# Patient Record
Sex: Male | Born: 1970 | Race: Black or African American | Hispanic: No | Marital: Married | Smoking: Current some day smoker
Health system: Southern US, Community
[De-identification: ages and names within clinical notes are randomized; demographics above are authoritative.]

## PROBLEM LIST (undated history)

## (undated) DIAGNOSIS — I10 Essential (primary) hypertension: Secondary | ICD-10-CM

## (undated) DIAGNOSIS — A0472 Enterocolitis due to Clostridium difficile, not specified as recurrent: Secondary | ICD-10-CM

## (undated) DIAGNOSIS — Q231 Congenital insufficiency of aortic valve: Secondary | ICD-10-CM

## (undated) DIAGNOSIS — E119 Type 2 diabetes mellitus without complications: Secondary | ICD-10-CM

## (undated) DIAGNOSIS — R011 Cardiac murmur, unspecified: Secondary | ICD-10-CM

## (undated) DIAGNOSIS — K219 Gastro-esophageal reflux disease without esophagitis: Secondary | ICD-10-CM

## (undated) DIAGNOSIS — Q2381 Bicuspid aortic valve: Secondary | ICD-10-CM

## (undated) DIAGNOSIS — E785 Hyperlipidemia, unspecified: Secondary | ICD-10-CM

## (undated) DIAGNOSIS — K5792 Diverticulitis of intestine, part unspecified, without perforation or abscess without bleeding: Secondary | ICD-10-CM

## (undated) HISTORY — PX: COLONOSCOPY: SHX174

## (undated) HISTORY — DX: Bicuspid aortic valve: Q23.81

## (undated) HISTORY — DX: Enterocolitis due to Clostridium difficile, not specified as recurrent: A04.72

## (undated) HISTORY — DX: Type 2 diabetes mellitus without complications: E11.9

## (undated) HISTORY — DX: Gastro-esophageal reflux disease without esophagitis: K21.9

## (undated) HISTORY — DX: Cardiac murmur, unspecified: R01.1

## (undated) HISTORY — DX: Congenital insufficiency of aortic valve: Q23.1

## (undated) HISTORY — DX: Essential (primary) hypertension: I10

---

## 1996-07-09 HISTORY — PX: OTHER SURGICAL HISTORY: SHX169

## 2007-05-25 ENCOUNTER — Inpatient Hospital Stay (HOSPITAL_COMMUNITY): Admission: EM | Admit: 2007-05-25 | Discharge: 2007-05-27 | Payer: Self-pay | Admitting: Emergency Medicine

## 2007-05-27 ENCOUNTER — Ambulatory Visit: Payer: Self-pay | Admitting: Gastroenterology

## 2008-02-18 ENCOUNTER — Ambulatory Visit: Payer: Self-pay | Admitting: General Practice

## 2008-09-14 ENCOUNTER — Encounter: Payer: Self-pay | Admitting: Interventional Cardiology

## 2008-12-09 IMAGING — CT CT PELVIS W/ CM
1 of 3 series · 14 of 32 positions shown, 19 images · IV contrast (Omnipaque 300)
Comparison: none

HISTORY: Abdominal pain, nausea, vomiting

[Series 2: abd_pel 5.0 b40f · axial · 0.69mm/px · z∈[+486,+901]mm · 14 of 95 slices shown, 19 images]
[im 6/95  soft-tissue]
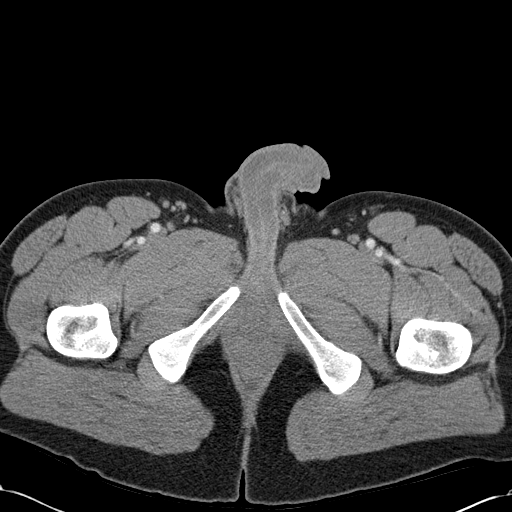
[im 6/95  bone]
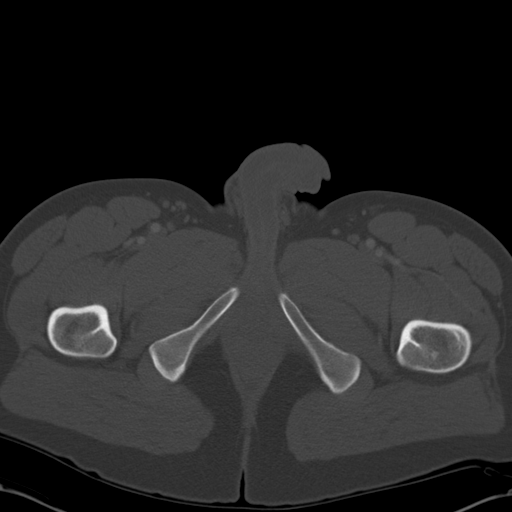
[im 11/95  soft-tissue]
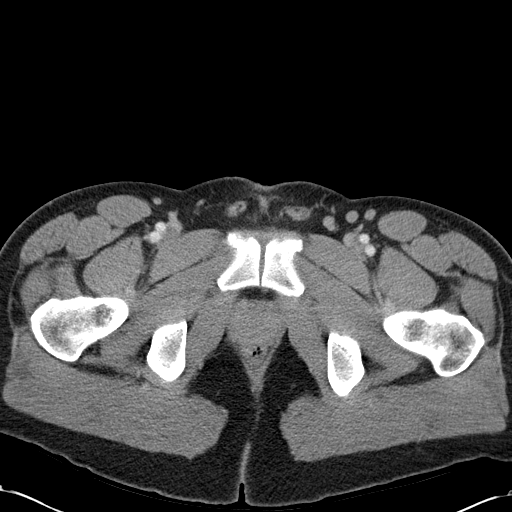
[im 21/95  soft-tissue]
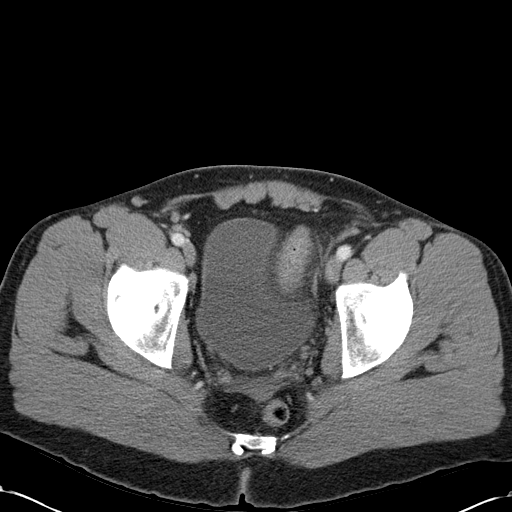
[im 27/95  soft-tissue]
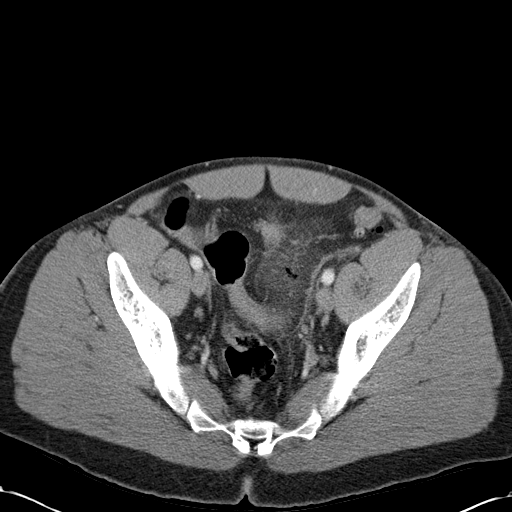
[im 32/95  soft-tissue]
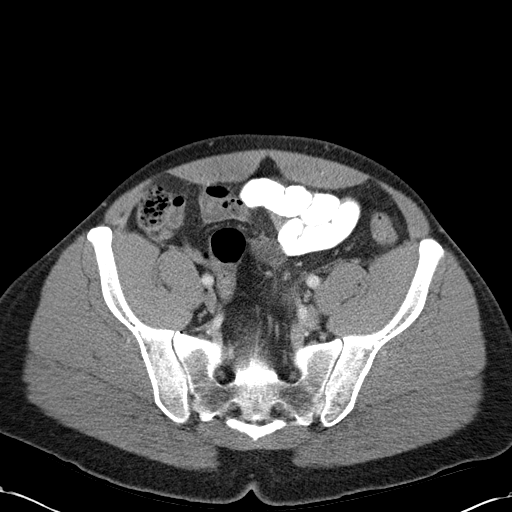
[im 42/95  soft-tissue]
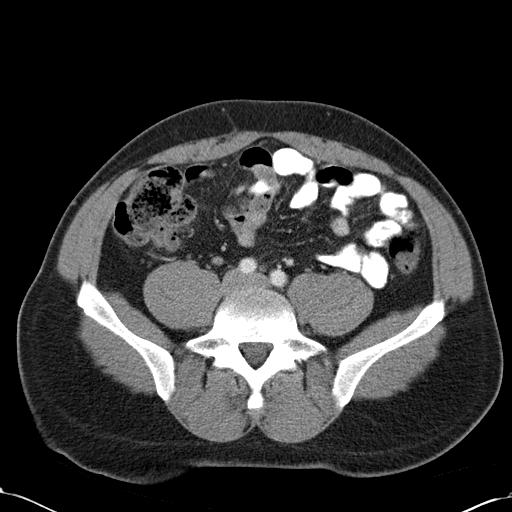
[im 48/95  soft-tissue]
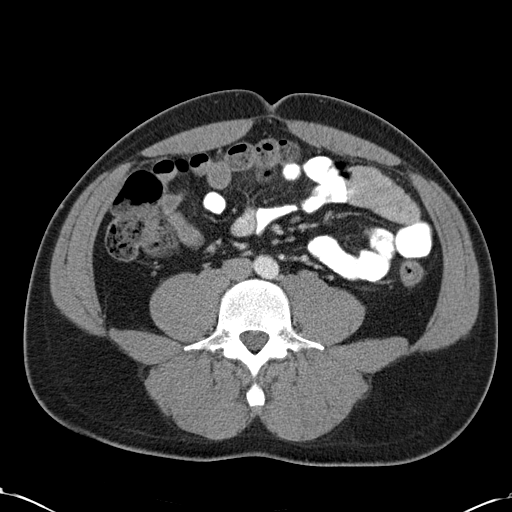
[im 53/95  soft-tissue]
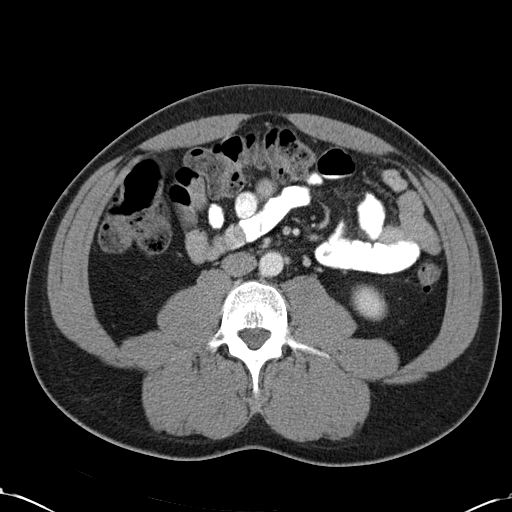
[im 63/95  soft-tissue]
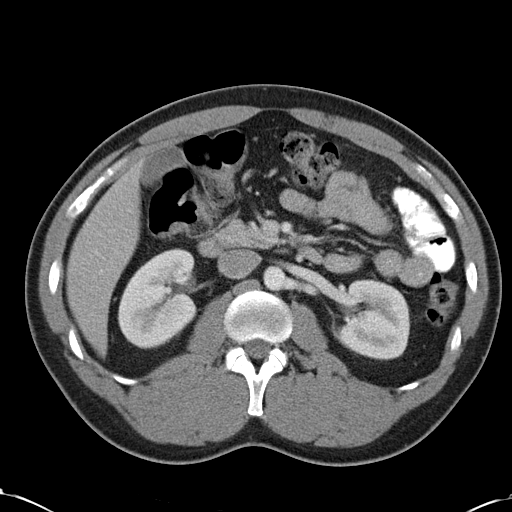
[im 63/95  bone]
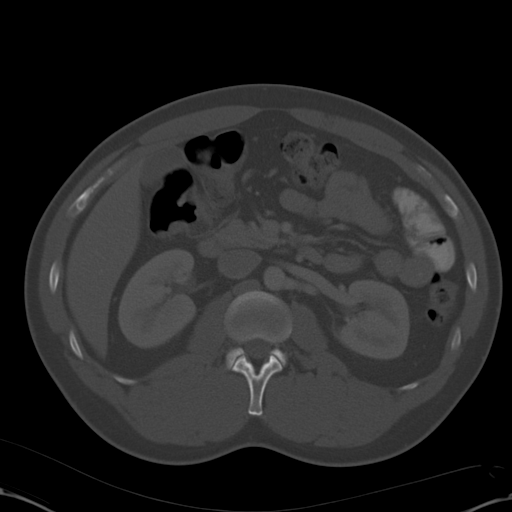
[im 68/95  soft-tissue]
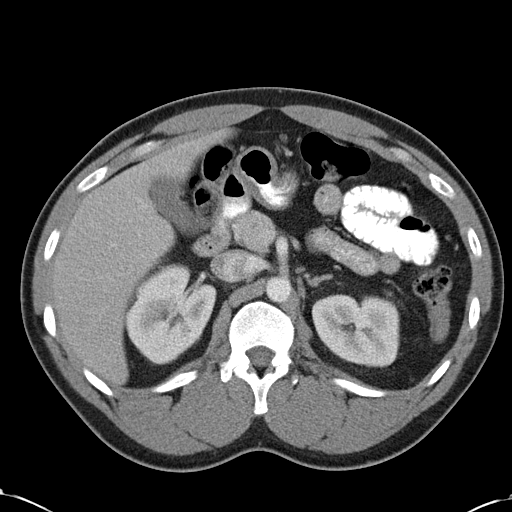
[im 74/95  soft-tissue]
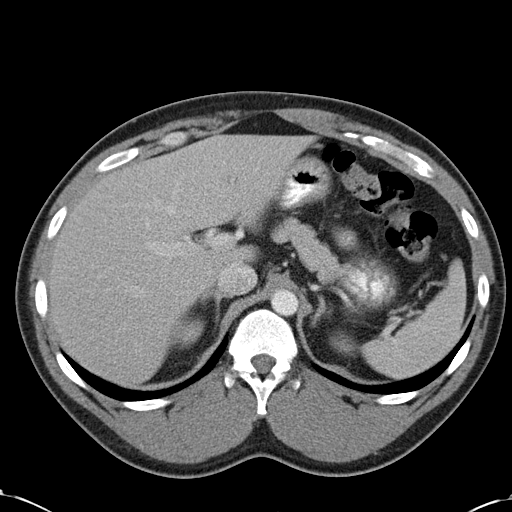
[im 74/95  lung]
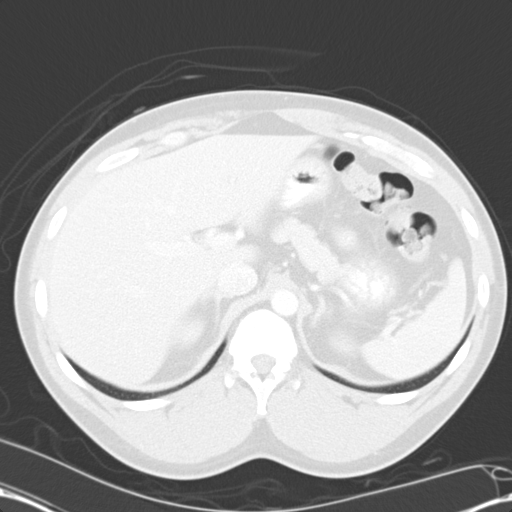
[im 79/95  lung]
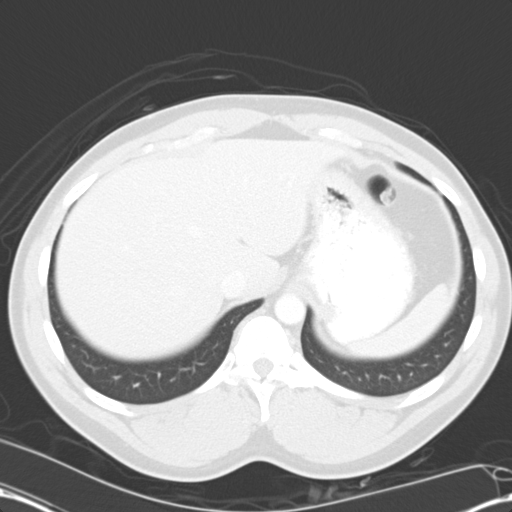
[im 84/95  soft-tissue]
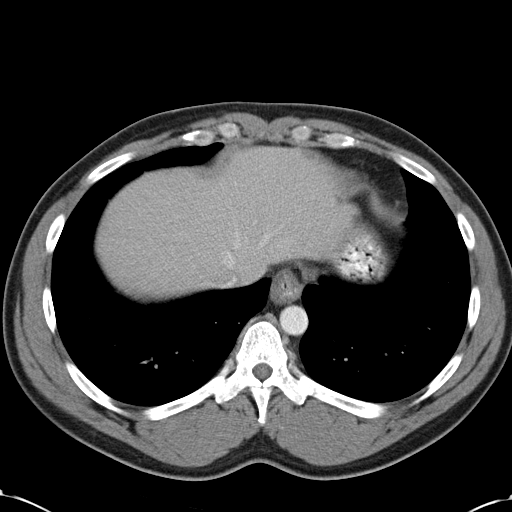
[im 84/95  lung]
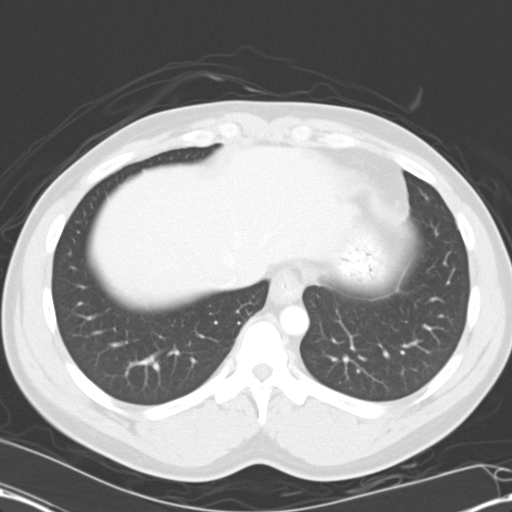
[im 89/95  soft-tissue]
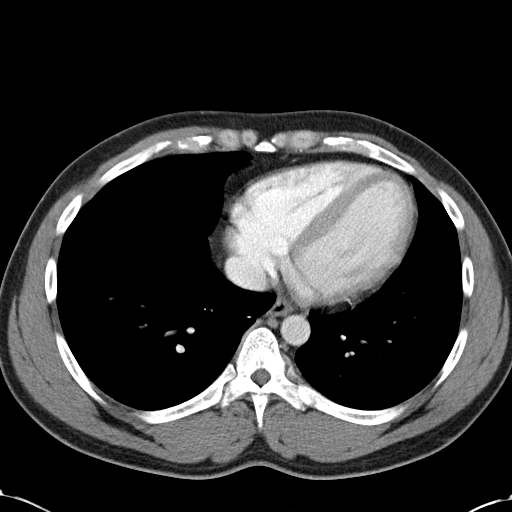
[im 89/95  lung]
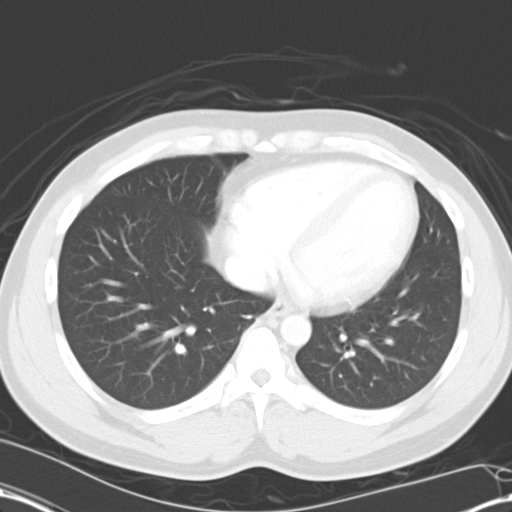

[14 of 32 positions shown; findings below may reference images not displayed]

CT ABDOMEN AND PELVIS WITH CONTRAST:

Multidetector helical CT imaging abdomen and pelvis performed.
Sagittal and coronal images are reconstructed from the axial data set.
Exam utilized dilute oral contrast and 100 cc 8mnipaque-N55.
No prior exam for comparison.

CT ABDOMEN:

Lung bases clear.
Tiny nonspecific low attenuation focus inferior aspect right lobe liver image
30, too small to characterize.
Remainder of liver, spleen, pancreas, kidneys, and adrenal glands normal.
Stomach and upper abdominal bowel loops unremarkable.
No mass, adenopathy, free fluid, or inflammatory process.
IMPRESSION: No acute upper abdominal abnormalities, see below.

CT PELVIS:

Scattered diverticula sigmoid colon with extensive sigmoid wall thickening and
pericolic inflammatory changes compatible with acute diverticulitis.
Small focus of extraluminal gas identified within sigmoid mesocolon, images 68
through 70, 12 mm diameter.
No discrete abscess or extraluminal fluid collection.
Small amount of nonspecific free pelvic fluid.
Unremarkable bladder.
Normal appendix.
No pelvic mass, adenopathy, or hernia.
Bones unremarkable
IMPRESSION: Sigmoid diverticulitis with small extraluminal gas collection, 12 mm greatest
size.
Small nonspecific free pelvic fluid.
Findings called to [REDACTED] at time of interpretation.

## 2010-02-08 ENCOUNTER — Emergency Department (HOSPITAL_COMMUNITY): Admission: EM | Admit: 2010-02-08 | Discharge: 2010-02-08 | Payer: Self-pay | Admitting: Emergency Medicine

## 2010-09-07 DIAGNOSIS — A0472 Enterocolitis due to Clostridium difficile, not specified as recurrent: Secondary | ICD-10-CM

## 2010-09-07 HISTORY — DX: Enterocolitis due to Clostridium difficile, not specified as recurrent: A04.72

## 2010-09-22 LAB — CBC
MCH: 31.2 pg (ref 26.0–34.0)
MCV: 93.4 fL (ref 78.0–100.0)
RBC: 4.47 MIL/uL (ref 4.22–5.81)

## 2010-09-22 LAB — BASIC METABOLIC PANEL
Creatinine, Ser: 1.08 mg/dL (ref 0.4–1.5)
GFR calc non Af Amer: >60 mL/min (ref >60)
Potassium: 3.5 mEq/L (ref 3.5–5.1)
Sodium: 138 mEq/L (ref 135–145)

## 2010-09-22 LAB — DIFFERENTIAL
Basophils Absolute: 0.1 10*3/uL (ref 0.0–0.1)
Basophils Relative: 1 % (ref 0–1)
Eosinophils Absolute: 0.7 10*3/uL (ref 0.0–0.7)
Lymphocytes Relative: 41 % (ref 12–46)
Lymphs Abs: 4.2 10*3/uL — ABNORMAL HIGH (ref 0.7–4.0)
Monocytes Relative: 5 % (ref 3–12)
Neutrophils Relative %: 47 % (ref 43–77)

## 2010-11-21 NOTE — Discharge Summary (Signed)
NAMEMARQUIES, [name]                ACCOUNT NO.:  [id number]   MEDICAL RECORD NO.:  [id number]          PATIENT TYPE:  INP   LOCATION:  A320                          FACILITY:  [facility]   PHYSICIAN:  Skeet Latch, DO    DATE OF BIRTH:  05-09-1971   DATE OF ADMISSION:  05/25/2007  DATE OF DISCHARGE:  11/18/2008LH                               DISCHARGE SUMMARY   DISCHARGE DIAGNOSES:  1. Acute diverticulitis.  2. Hypertension.  3. Leukocytosis.   BRIEF HOSPITAL COURSE:  Mr. [name] is a 40 year old African American  male who presented with lower abdominal pain.  The patient states for  approximately 3 days he is having abdominal pain.  He noticed that after  dinner he began to have an upset stomach and went to the rest room and  had a bowel movement.  Two hours later he developed cramps and had  steady abdominal pain ever since.  The patient states that initially it  felt like gas-type pains and when he started to have another bowel  movement, he could not and he started to become diaphoretic.  The  patient went to work the following day despite the pain and went to an  urgent care.  Urgent care diagnosed him with viral gastroenteritis and  he was sent home.  The patient went to his daughter's birthday party  with abdominal pain, and he could not tolerate it and came to the  emergency room for evaluation.  The patient in the emergency room had a  CT scan which showed acute diverticulitis.  The patient was admitted to  a general medical bed.  He was kept n.p.o., placed on IV fluids as well  as IV antibiotics.  The patient's diet increased to clear liquids.  The  patient started to have remitting of his pain as his diet was increased.  The patient's initial white count started to come down also during his  hospital stay.  The patient's pain started at an 8/9-10 and had  decreased to about 2-3/10.  GI was consulted on the last day prior to  discharge and agreed with the current medical  treatment.  It was felt  that May 27, 2007, the patient was stable enough to be discharged  to home.   VITALS ON DISCHARGE:  Temperature is 97.5, pulse 72, respirations 16,  blood pressure 130/99, saturating 98% on room air.   DISCHARGE LABS:  Sodium 137, potassium 3.8, chloride 100, CO2 28,  glucose 118, BUN 6, creatinine 1.12.  White count 10.3, hemoglobin 12.5,  hematocrit 39.0, platelet count was 280.   MEDICATIONS ON DISCHARGE:  The patient was given a prescription for  Cipro and Flagyl to complete a 10-day course.  He is to maintain his  hydrochlorothiazide 25 mg daily.   CONDITION ON DISCHARGE:  Stable.   DISCHARGE DISPOSITION:  The patient discharged to home with  instructions:  The patient is to follow up with primary care physician  in the next 1-2 weeks.  The patient also is to have a colonoscopy  probably in the next 4-6 weeks.  Patient instructed  to maintain a clear  liquid diet and advance as tolerated with very bland foods.  The patient  understood these directions and was discharged in stable condition.      Skeet Latch, DO  Electronically Signed     SM/MEDQ  D:  05/28/2007  T:  05/28/2007  Job:  [id number]

## 2010-11-21 NOTE — Consult Note (Signed)
[name], [name]                ACCOUNT NO.:  [id number]   MEDICAL RECORD NO.:  [id number]          PATIENT TYPE:  INP   LOCATION:  A320                          FACILITY:  [facility]   PHYSICIAN:  Kassie Mends, M.D.      DATE OF BIRTH:  [birth year]   DATE OF CONSULTATION:  05/27/2007  DATE OF DISCHARGE:                                 CONSULTATION   REASON FOR CONSULTATION:  Diverticulitis.   PHYSICIAN REQUESTING CONSULTATION:  [facility].   HISTORY OF PRESENT ILLNESS:  This patient is a 40 year old gentleman who  presented to the emergency department with complaints of several-day  history of lower abdominal pain.  He states a Friday evening after he  ate dinner, he began having some abdominal  cramping and had a loose  stool.  He thought he may have simply had an upset stomach.  He went on  to work Saturday, and because of progressive pain went to the Urgent  Care for further evaluation.  He was evaluated and sent home on some  supportive therapy.  Over the course of 24 hours, his abdominal  pain  progressed and was located in the lower abdomen, mostly on the left  lower quadrant region.  He decided to come to the emergency department  for further evaluation.  He has not had a bowel movement in  approximately 24 hours.  Denies any melena or bright red blood per  rectum.  This is his first episode similar to this.  He had some nausea  but no vomiting.  Upon evaluation, he had a CT of the abdomen and  pelvis, which revealed extensive sigmoid wall thickening and pericolic  inflammatory changes consistent with acute diverticulitis.  He had a  small focus of extraluminal gas in the sigmoid mesocolon.  No discreet  abscess or extraluminal fluid collection was noted.  He did have a small  amount of nonspecific free pelvic fluid.  He is also noted to have a  white blood cell count of 16,600.  He was admitted for further  management.  He has been on Cipro and Flagyl.  Today, he states he  is  feeling very well.  He is wanting to go home.  His abdominal pain has  improved, and he is able to tolerate clear liquids.  He has never had  any diverticulitis before.   MEDICATIONS AT HOME:  HCTZ 25 mg daily.   ALLERGIES:  NO KNOWN DRUG ALLERGIES.   PAST MEDICAL HISTORY:  Hypertension, recently diagnosed.   PAST SURGICAL HISTORY:  He had a repair of a left tibia-fibular  fracture, 1998.   FAMILY HISTORY:  His father is 9, has a history of colonic polyps.  No  history of colorectal cancer.   SOCIAL HISTORY:  He is engaged to be married.  He has a 14-year-old  daughter.  He is a IT sales professional for [employer].  He smokes three or four  cigars daily for 13 years.  He consumes about two to four beers a week,  denies any drug use.   REVIEW OF SYSTEMS:  See HPI  for GI.  CONSTITUTIONAL:  Denies any  unintentional weight loss.  CARDIOPULMONARY:  Denies any chest pain,  shortness of breath, cough, palpitations.  GENITOURINARY:  Denies any  dysuria, hematuria.   PHYSICAL EXAMINATION:  VITAL SIGNS:  Temp 98.7, pulse 76, respirations  20, blood pressure 142/75, height 72 inches, weight 97 kg.  GENERAL:  Pleasant well-nourished, well-developed gentleman, in no acute  distress.  SKIN: Warm and dry, no jaundice.  HEENT:  Sclerae nonicteric.  Oropharyngeal mucosa moist and pink.  No  lesions, erythema or exudate.  No lymphadenopathy, thyromegaly.  CHEST:  Lungs are clear to auscultation.  CARDIAC:  Exam reveals a regular rate and rhythm, normal S1, S2.  No  murmurs, rubs or gallops.  ABDOMEN:  Positive bowel sounds, abdomen soft.  He has mild to moderate  tenderness in the left lower quadrant region, suprapubic region to deep  palpation.  No rebound, tenderness or guarding.  No abdominal bruits or  hernias.  No hepatosplenomegaly or masses.  EXTREMITIES:  No edema.   LABORATORY:  White count is down to 10,300 today, hemoglobin 12.5,  platelets 280,000, sodium 137, potassium 3.8, BUN 6,  creatinine 1.12,  glucose 118.  CT:  As outlined above.  In addition, he had an acute  abdominal series which revealed a questionable early small bowel  obstruction.   IMPRESSION:  The patient is a 40 year old gentleman with acute  diverticulitis based on CT findings.  This is his first episode, and  clinically he appears to be improving without any complications.  He had  extraluminal gas noted in the mesocolon on CT, but no obvious fluid  collections or abscess.   RECOMMENDATIONS:  Will review CT with Dr. Cira Servant; however, I suspect the  patient would be stable for discharge later today.  Would recommend  completing a 14-day course of Cipro and Flagyl and consider colonoscopy  in 4 to 6 weeks.  I would like [facility] team for allowing [place] to take  part in the care of this patient.      Tana Coast, P.A.      Kassie Mends, M.D.  Electronically Signed    LL/MEDQ  D:  05/27/2007  T:  05/27/2007  Job:  [id number]

## 2010-11-21 NOTE — Group Therapy Note (Signed)
NAMEIGNAZIO, KINCAID                ACCOUNT NO.:  [id number]   MEDICAL RECORD NO.:  [id number]          PATIENT TYPE:  INP   LOCATION:  A320                          FACILITY:  [facility]   PHYSICIAN:  Skeet Latch, DO    DATE OF BIRTH:  06/03/1971   DATE OF PROCEDURE:  05/27/2007  DATE OF DISCHARGE:                                 PROGRESS NOTE   SUBJECTIVE:  Mr. Laboy is a 40 year old male who presented with lower  abdominal pain.  The patient complains of abdominal pain for  approximately 3 days.  The patient states the pain is cramping in nature  and steadily became severe.  The patient initially felt that the pain  was gas but had problems having bowel movements and became diaphoretic.  The patient went to an urgent care and was told he had viral  gastroenteritis and was sent home.  The patient went to his daughter's  birthday party and came to the ER after having of severe abdominal pain.  The patient had a CT scan performed in the emergency room and this  showed acute diverticulitis.  Today, the patient's pain is steadily  improving.  The patient continues to be on IV fluids as well as IV  antibiotics.   OBJECTIVE:  VITAL SIGNS:  Temperature is 98.6, pulse 75, respirations  18, blood pressure 125/72.  CARDIOVASCULAR:  Regular rate and rhythm.  No rubs, gallops or murmurs.  LUNGS:  Clear to auscultation bilaterally.  No rales, rhonchi or  wheezing.  ABDOMEN:  Soft.  Minimal tenderness.  No rigidity, guarding.  Nondistended.  EXTREMITIES:  No clubbing, cyanosis or edema.   LABORATORY DATA:  Sodium 137, potassium 4.2, chloride 100, CO2 29,  glucose 108, BUN 9, creatinine 1.136.  White count 14.2, hemoglobin 13,  hematocrit 39, platelets 277,000.   ASSESSMENT/PLAN:  1. Acute diverticulitis.  The patient continues to be on IV Cipro and      Flagyl.  The patient's diet has been increased to clear liquids.      The patient continues to be on IV pain medications.  2. Hypertension.   Seems to be controlled on hydrochlorothiazide will      continue at this point.  3. Leukocytosis seems to be improving with IV antibiotics.  4. Will get a gastroenterology consult secondary to the patient will      need a colonoscopy in the next 2 to 4 weeks and does not have a      regular primary physician at this time.  Will get      gastroenterology's recommendations regarding time frame when the      patient will need a colonoscopy.  Otherwise the patient is stable      at this time.      Skeet Latch, DO  Electronically Signed     SM/MEDQ  D:  05/27/2007  T:  05/27/2007  Job:  [id number]

## 2010-11-21 NOTE — H&P (Signed)
[name], [name]                ACCOUNT NO.:  [id number]   MEDICAL RECORD NO.:  [id number]          PATIENT TYPE:  INP   LOCATION:  A320                          FACILITY:  [facility]   PHYSICIAN:  Skeet Latch, DO    DATE OF BIRTH:  1970/10/18   DATE OF ADMISSION:  05/25/2007  DATE OF DISCHARGE:  LH                              HISTORY & PHYSICAL   CHIEF COMPLAINT:  Abdominal pain.   HISTORY OF PRESENT ILLNESS:  This is a 40 year old male who presents  with lower abdominal pain.  The patient states that approximately 3 days  prior he begin having abdominal pain after dinner.  The patient states  that after eating dinner he began to feel that his stomach was upset and  went to the bathroom and had a bowel movement.  The patient states that  approximately 2 hours later he started to developed cramps and has had  steady abdominal pain ever since.  The patient states initially felt  like gas-type pains.  States that he has been trying to have a bowel  movement especially today and could not, and began to become  diaphoretic.  The patient states that he went to work today despite the  pain and then after work went to an urgent care.  The patient states the  urgent care diagnosed him with viral gastroenteritis and sent him home.  The patient states that his daughter had a birthday party this p.m. and  states that after the party he could not tolerate the pain and decided  to come to the emergency room for evaluation.  Upon being seen in the  emergency room, the patient had an acute abdominal series which showed  partial bowel obstruction.  Upon further evaluation by CT scan he was  found to have acute diverticulitis.  At this time the patient is stable,  pain slightly improved, and he is stable and lying in bed.   PAST MEDICAL HISTORY:  Recent diagnosis of hypertension.   MEDICATIONS:  Hydrochlorothiazide 25 mg daily.   PAST SURGICAL HISTORY:  Left tib-fib fracture with plate  placement.   ALLERGIES:  No known drug allergies.   SOCIAL HISTORY:  Smokes three to four cigars daily for approximately 13  years.  Admits to approximately four beers a week and denies any illicit  drug use.   PAST FAMILY HISTORY:  Positive for hypertension and diabetes.   REVIEW OF SYSTEMS:  CONSTITUTIONAL:  Denies any fever or chills, weight  loss, weight gain.  HEENT:  Unremarkable.  CARDIOVASCULAR:  No chest  pain, palpitations.  RESPIRATORY:  No cough, wheezing, dyspnea.  GASTROINTESTINAL:  Positive for abdominal pain.  No diarrhea or rectal  bleeding.  HEMATOLOGIC:  Unremarkable.  PSYCHIATRIC:  Unremarkable.  NEUROLOGIC:  Also unremarkable.   PHYSICAL EXAMINATION:  VITAL SIGNS:  Temperature 98.9, respirations 20,  pulse 80, blood pressure 140/94.  GENERAL:  He is well-developed, well-nourished, well-hydrated, no acute  distress.  HEENT:  Head is atraumatic, normocephalic.  PERRL.  EOMI.  No scleral  icterus.  NECK:  Soft, supple, nontender, nondistended.  No  JVD.  No thyromegaly.  CARDIOVASCULAR:  Regular rate and rhythm.  No rubs, gallops or murmurs.  LUNGS:  Clear to auscultation bilaterally.  No rales, rhonchi or  wheezing.  ABDOMEN:  Soft, nondistended.  Positive lower left quadrant pain with  some suprapubic pain also.  No rebound or guarding.  Decreased bowel  sounds.  EXTREMITIES:  No clubbing, cyanosis or edema.  NEUROLOGIC:  Cranial nerves II-XII are grossly intact.  The patient is  alert and oriented x3.   RADIOLOGIC STUDIES:  Abdominal series showed a partial small-bowel  obstruction.  CT of his abdomen and pelvis showed sigmoid diverticulitis  with small external gas collection, small nonspecific free pelvic fluid,  unremarkable gallbladder, normal appendix, and no pelvic masses,  adenopathy or hernias were appreciated.   LABORATORIES:  His urinalysis was unremarkable.  Sodium 133, potassium  3.9, chloride 98, CO2 29, glucose 121, BUN 12, creatinine 1.16.   White  count 16.6, hemoglobin 13.6, hematocrit 40.8, platelets 283.   ASSESSMENT AND PLAN:  1. Acute diverticulitis with abdominal pain.  The patient will placed      on IV Cipro and Flagyl.  The patient will also be IV hydrated and      kept n.p.o. and slowly advanced to a clear-liquid diet.  The      patient also be on IV pain medication and will be adjusted as      needed.  2. For his leukocytosis, probably secondary to his diverticulitis.      Will continue with IV antibiotics and will repeat a white count in      the a.m.  3. For his hyponatremia, will IV hydrate and hopefully this will      correct with IV fluid hydration.  The patient will be followed      closely.      Skeet Latch, DO  Electronically Signed     SM/MEDQ  D:  05/26/2007  T:  05/26/2007  Job:  [id number]

## 2011-04-17 LAB — CBC
HCT: 37 — ABNORMAL LOW
HCT: 40.8
Hemoglobin: 12.5 — ABNORMAL LOW
MCHC: 33.4
MCHC: 33.4
MCHC: 33.8
MCV: 90.4
MCV: 90.5
MCV: 90.6
Platelets: 283
RBC: 4.09 — ABNORMAL LOW
RBC: 4.32
RDW: 12.3
RDW: 12.6
WBC: 14.2 — ABNORMAL HIGH

## 2011-04-17 LAB — DIFFERENTIAL
Basophils Relative: 0
Basophils Relative: 1
Eosinophils Absolute: 0.2
Eosinophils Absolute: 0.2
Eosinophils Absolute: 0.4
Eosinophils Relative: 1
Eosinophils Relative: 2
Eosinophils Relative: 4
Lymphocytes Relative: 19
Lymphocytes Relative: 20
Lymphocytes Relative: 22
Lymphs Abs: 2.8
Lymphs Abs: 3.2
Monocytes Absolute: 0.7
Monocytes Relative: 10
Monocytes Relative: 6
Neutro Abs: 6.9
Neutrophils Relative %: 74

## 2011-04-17 LAB — BASIC METABOLIC PANEL
BUN: 6
BUN: 9
CO2: 28
CO2: 29
CO2: 29
Calcium: 9.1
Creatinine, Ser: 1.12
GFR calc Af Amer: >60
GFR calc Af Amer: >60
GFR calc non Af Amer: >60
GFR calc non Af Amer: >60
Glucose, Bld: 121 — ABNORMAL HIGH
Potassium: 3.8
Sodium: 133 — ABNORMAL LOW

## 2011-04-17 LAB — URINALYSIS, ROUTINE W REFLEX MICROSCOPIC
Hgb urine dipstick: NEGATIVE
Urobilinogen, UA: 0.2
pH: 6

## 2011-05-11 ENCOUNTER — Other Ambulatory Visit: Payer: Self-pay | Admitting: Family Medicine

## 2011-05-11 ENCOUNTER — Ambulatory Visit
Admission: RE | Admit: 2011-05-11 | Discharge: 2011-05-11 | Disposition: A | Discharge status: Home / Self Care | Payer: BC Managed Care – PPO | Source: Ambulatory Visit | Attending: Family Medicine | Admitting: Family Medicine

## 2011-05-11 DIAGNOSIS — M25469 Effusion, unspecified knee: Secondary | ICD-10-CM

## 2013-07-06 ENCOUNTER — Encounter: Payer: Self-pay | Admitting: Cardiology

## 2013-07-06 DIAGNOSIS — I10 Essential (primary) hypertension: Secondary | ICD-10-CM

## 2013-07-06 DIAGNOSIS — I359 Nonrheumatic aortic valve disorder, unspecified: Secondary | ICD-10-CM

## 2013-07-06 DIAGNOSIS — Z87891 Personal history of nicotine dependence: Secondary | ICD-10-CM | POA: Insufficient documentation

## 2013-07-06 DIAGNOSIS — F17200 Nicotine dependence, unspecified, uncomplicated: Secondary | ICD-10-CM

## 2013-07-14 ENCOUNTER — Ambulatory Visit: Payer: BC Managed Care – PPO | Admitting: Interventional Cardiology

## 2013-07-23 ENCOUNTER — Ambulatory Visit: Payer: BC Managed Care – PPO | Admitting: Interventional Cardiology

## 2013-07-24 ENCOUNTER — Ambulatory Visit: Payer: BC Managed Care – PPO | Admitting: Interventional Cardiology

## 2013-08-19 ENCOUNTER — Ambulatory Visit (INDEPENDENT_AMBULATORY_CARE_PROVIDER_SITE_OTHER): Payer: BC Managed Care – PPO | Admitting: Interventional Cardiology

## 2013-08-19 ENCOUNTER — Encounter: Payer: Self-pay | Admitting: Interventional Cardiology

## 2013-08-19 VITALS — BP 130/96 | HR 77 | Ht 72.0 in | Wt 213.8 lb

## 2013-08-19 DIAGNOSIS — I359 Nonrheumatic aortic valve disorder, unspecified: Secondary | ICD-10-CM

## 2013-08-19 DIAGNOSIS — F17200 Nicotine dependence, unspecified, uncomplicated: Secondary | ICD-10-CM

## 2013-08-19 DIAGNOSIS — I10 Essential (primary) hypertension: Secondary | ICD-10-CM

## 2013-08-19 MED ORDER — LISINOPRIL-HYDROCHLOROTHIAZIDE 10-12.5 MG PO TABS: 1.0000 | ORAL_TABLET | Freq: Every day | ORAL | Status: DC

## 2013-08-19 MED ORDER — PRAVASTATIN SODIUM 20 MG PO TABS: 20.0000 mg | ORAL_TABLET | Freq: Every day | ORAL | Status: DC

## 2013-08-19 NOTE — Progress Notes (Signed)
Patient ID: [name], male   DOB: [birth year], 43 y.o.   MRN: [id number]    [place], [address], [place]  [address] Phone: [phone] Fax:  [phone]  Date:  08/19/2013   ID:  [name], DOB [birth year], MRN [id number]  PCP:  No primary provider on file.      History of Present Illness: [name] is a 43 y.o. male y/o with a bicuspid aortic valve. BP was controlled. Hypertension:  c/o Chest pain at rest, lasts few seconds, feel s like a muscle pull. Not related to exertion..  Denies : Dizziness.  Leg edema.  Palpitations.  Cough.  Syncope.    BP at home as been in the 130/70 range.  No CP or SHOB.  Exercising several times a week, crossfit, weight lifting.  No symptoms.    Wt Readings from Last 3 Encounters:  08/19/13 213 lb 12.8 oz (96.979 kg)     Past Medical History  Diagnosis Date  . Hypertension   . Diverticulosis   . Bicuspid aortic valve   . C. difficile colitis 09/2010    following 2 rounds of ABX- cipro and augmentin    Current Outpatient Prescriptions  Medication Sig Dispense Refill  . hydrochlorothiazide (MICROZIDE) 12.5 MG capsule Take 12.5 mg by mouth daily.      Marland Kitchen lisinopril (PRINIVIL,ZESTRIL) 10 MG tablet Take 10 mg by mouth daily.      . Multiple Vitamins-Minerals (MULTIVITAMIN PO) Take by mouth daily.      . pravastatin (PRAVACHOL) 20 MG tablet Take 20 mg by mouth daily.       No current facility-administered medications for this visit.    Allergies:   No Known Allergies  Social History:  The patient  reports that he has been smoking Cigars.  He does not have any smokeless tobacco history on file. He reports that he drinks about 3.0 ounces of alcohol per week.   Family History:  The patient's family history includes Benign prostatic hyperplasia in his father; Diabetes in his father; Hypertension in his brother, father, and mother.   ROS:  Please see the history of present illness.  No nausea, vomiting.  No fevers,  chills.  No focal weakness.  No dysuria.    All other systems reviewed and negative.   PHYSICAL EXAM: VS:  BP 130/96  Pulse 77  Ht 6' (1.829 m)  Wt 213 lb 12.8 oz (96.979 kg)  BMI 28.99 kg/m2 Well nourished, well developed, in no acute distress HEENT: normal Neck: no JVD, no carotid bruits Cardiac:  normal S1, S2; RRR;  Lungs:  clear to auscultation bilaterally, no wheezing, rhonchi or rales Abd: soft, nontender, no hepatomegaly Ext: no edema Skin: warm and dry Neuro:   no focal abnormalities noted  EKG:  Normal sinus rhythm, no ST segment changes, left axis deviation  Echocardiogram from January 2014 showed normal left ventricular function, bicuspid aortic valve, mild aortic regurgitation, trace mitral regurgitation, LVEF 60-65%.  Exercise treadmill test in 2010:12 minutes on the treadmill. No ischemia. No symptoms     Labs reviewed: Normal creatinine, total cholesterol 171, triglycerides 51, HDL 63, LDL 98  ASSESSMENT AND PLAN:  Aortic regurgitation  Bicuspid aortic valve. No symptoms from this. Mildly dilated aortic root which likely causes the aortic regurgitation. We'll check echo in a few years unless he has symptoms.    2. Unspecified essential hypertension  Blood pressure well-controlled at home. He should continue to check this  periodically at home. Encouraged compliance of meds.    3. Tobacco dependence  I recommended that he stop Smoking cigars.    Preventive Medicine  Adult topics discussed:  Exercise: Continue present exercise program, mandatory at work..    Follow Up     Signed, Mina Marble, MD, [facility] 08/19/2013 10:02 AM

## 2013-08-19 NOTE — Patient Instructions (Signed)
Your physician has recommended you make the following change in your medication:   1. Stop lisinorpil 10mg  and hctz 12.5 mg.  2. Start Lisinopril-hctz 10-12.5 mg daily.   Your physician wants you to follow-up in: 1 year with Dr. Irish Lack.  You will receive a reminder letter in the mail two months in advance. If you don't receive a letter, please call our office to schedule the follow-up appointment.

## 2013-11-08 ENCOUNTER — Inpatient Hospital Stay (HOSPITAL_COMMUNITY)
Admission: EM | Admit: 2013-11-08 | Discharge: 2013-11-10 | DRG: 392 | Disposition: A | Discharge status: Home / Self Care | Payer: BC Managed Care – PPO | Attending: Internal Medicine | Admitting: Internal Medicine

## 2013-11-08 ENCOUNTER — Emergency Department (HOSPITAL_COMMUNITY): Payer: BC Managed Care – PPO

## 2013-11-08 ENCOUNTER — Encounter (HOSPITAL_COMMUNITY): Payer: Self-pay | Admitting: Emergency Medicine

## 2013-11-08 DIAGNOSIS — E785 Hyperlipidemia, unspecified: Secondary | ICD-10-CM | POA: Diagnosis present

## 2013-11-08 DIAGNOSIS — R7309 Other abnormal glucose: Secondary | ICD-10-CM | POA: Diagnosis present

## 2013-11-08 DIAGNOSIS — K5732 Diverticulitis of large intestine without perforation or abscess without bleeding: Principal | ICD-10-CM | POA: Diagnosis present

## 2013-11-08 DIAGNOSIS — F17200 Nicotine dependence, unspecified, uncomplicated: Secondary | ICD-10-CM | POA: Diagnosis present

## 2013-11-08 DIAGNOSIS — Z87891 Personal history of nicotine dependence: Secondary | ICD-10-CM | POA: Diagnosis present

## 2013-11-08 DIAGNOSIS — Z833 Family history of diabetes mellitus: Secondary | ICD-10-CM

## 2013-11-08 DIAGNOSIS — I10 Essential (primary) hypertension: Secondary | ICD-10-CM | POA: Diagnosis present

## 2013-11-08 DIAGNOSIS — Z8249 Family history of ischemic heart disease and other diseases of the circulatory system: Secondary | ICD-10-CM

## 2013-11-08 DIAGNOSIS — I359 Nonrheumatic aortic valve disorder, unspecified: Secondary | ICD-10-CM

## 2013-11-08 DIAGNOSIS — K5792 Diverticulitis of intestine, part unspecified, without perforation or abscess without bleeding: Secondary | ICD-10-CM | POA: Diagnosis present

## 2013-11-08 HISTORY — DX: Hyperlipidemia, unspecified: E78.5

## 2013-11-08 HISTORY — DX: Diverticulitis of intestine, part unspecified, without perforation or abscess without bleeding: K57.92

## 2013-11-08 LAB — COMPREHENSIVE METABOLIC PANEL
ALT: 25 U/L (ref 0–53)
AST: 21 U/L (ref 0–37)
Albumin: 3.7 g/dL (ref 3.5–5.2)
Alkaline Phosphatase: 73 U/L (ref 39–117)
BUN: 17 mg/dL (ref 6–23)
CO2: 24 mEq/L (ref 19–32)
Calcium: 9.3 mg/dL (ref 8.4–10.5)
Chloride: 100 mEq/L (ref 96–112)
Creatinine, Ser: 0.96 mg/dL (ref 0.50–1.35)
GFR calc non Af Amer: >90 mL/min (ref >90)
Glucose, Bld: 118 mg/dL — ABNORMAL HIGH (ref 70–99)
Potassium: 4 mEq/L (ref 3.7–5.3)
SODIUM: 136 meq/L — AB (ref 137–147)
TOTAL PROTEIN: 7.1 g/dL (ref 6.0–8.3)
Total Bilirubin: 0.4 mg/dL (ref 0.3–1.2)

## 2013-11-08 LAB — CBC WITH DIFFERENTIAL/PLATELET
Basophils Absolute: 0 10*3/uL (ref 0.0–0.1)
Basophils Relative: 0 % (ref 0–1)
EOS ABS: 0.6 10*3/uL (ref 0.0–0.7)
Eosinophils Relative: 6 % — ABNORMAL HIGH (ref 0–5)
HEMATOCRIT: 39.9 % (ref 39.0–52.0)
Hemoglobin: 13.9 g/dL (ref 13.0–17.0)
LYMPHS ABS: 2.8 10*3/uL (ref 0.7–4.0)
Lymphocytes Relative: 27 % (ref 12–46)
MCH: 32 pg (ref 26.0–34.0)
MCHC: 34.8 g/dL (ref 30.0–36.0)
MCV: 91.9 fL (ref 78.0–100.0)
MONO ABS: 0.6 10*3/uL (ref 0.1–1.0)
Monocytes Relative: 5 % (ref 3–12)
Neutro Abs: 6.3 10*3/uL (ref 1.7–7.7)
Neutrophils Relative %: 62 % (ref 43–77)
PLATELETS: 251 10*3/uL (ref 150–400)
RBC: 4.34 MIL/uL (ref 4.22–5.81)
RDW: 12.2 % (ref 11.5–15.5)
WBC: 10.3 10*3/uL (ref 4.0–10.5)

## 2013-11-08 LAB — URINALYSIS, ROUTINE W REFLEX MICROSCOPIC
Bilirubin Urine: NEGATIVE
Glucose, UA: NEGATIVE mg/dL
Hgb urine dipstick: NEGATIVE
Ketones, ur: NEGATIVE mg/dL
Leukocytes, UA: NEGATIVE
NITRITE: NEGATIVE
PROTEIN: NEGATIVE mg/dL
SPECIFIC GRAVITY, URINE: 1.01 (ref 1.005–1.030)
UROBILINOGEN UA: 0.2 mg/dL (ref 0.0–1.0)
pH: 6.5 (ref 5.0–8.0)

## 2013-11-08 LAB — LIPASE, BLOOD: LIPASE: 17 U/L (ref 11–59)

## 2013-11-08 MED ORDER — HYDROMORPHONE HCL PF 1 MG/ML IJ SOLN
0.5000 mg | INTRAMUSCULAR | Status: DC | PRN
  Administered 2013-11-08 – 2013-11-09 (×5): 1 mg via INTRAVENOUS
  Filled 2013-11-08 (×5): qty 1

## 2013-11-08 MED ORDER — POTASSIUM CHLORIDE IN NACL 20-0.9 MEQ/L-% IV SOLN
INTRAVENOUS | Status: DC
  Administered 2013-11-08 – 2013-11-09 (×3): via INTRAVENOUS

## 2013-11-08 MED ORDER — MORPHINE SULFATE 4 MG/ML IJ SOLN
4.0000 mg | Freq: Once | INTRAMUSCULAR | Status: AC
  Administered 2013-11-08: 4 mg via INTRAVENOUS
  Filled 2013-11-08: qty 1

## 2013-11-08 MED ORDER — PNEUMOCOCCAL VAC POLYVALENT 25 MCG/0.5ML IJ INJ
0.5000 mL | INJECTION | INTRAMUSCULAR | Status: AC
  Administered 2013-11-09: 0.5 mL via INTRAMUSCULAR
  Filled 2013-11-08: qty 0.5

## 2013-11-08 MED ORDER — LISINOPRIL 10 MG PO TABS
10.0000 mg | ORAL_TABLET | Freq: Every day | ORAL | Status: DC
  Administered 2013-11-08 – 2013-11-10 (×3): 10 mg via ORAL
  Filled 2013-11-08 (×3): qty 1

## 2013-11-08 MED ORDER — SODIUM CHLORIDE 0.9 % IV BOLUS (SEPSIS)
1000.0000 mL | Freq: Once | INTRAVENOUS | Status: AC
  Administered 2013-11-08: 1000 mL via INTRAVENOUS

## 2013-11-08 MED ORDER — METRONIDAZOLE IN NACL 5-0.79 MG/ML-% IV SOLN
500.0000 mg | Freq: Once | INTRAVENOUS | Status: AC
  Administered 2013-11-08: 500 mg via INTRAVENOUS
  Filled 2013-11-08: qty 100

## 2013-11-08 MED ORDER — ONDANSETRON HCL 4 MG/2ML IJ SOLN
4.0000 mg | Freq: Three times a day (TID) | INTRAMUSCULAR | Status: AC | PRN
  Administered 2013-11-08 (×2): 4 mg via INTRAVENOUS
  Filled 2013-11-08 (×2): qty 2

## 2013-11-08 MED ORDER — HYDROMORPHONE HCL PF 1 MG/ML IJ SOLN
1.0000 mg | Freq: Once | INTRAMUSCULAR | Status: AC
  Administered 2013-11-08: 1 mg via INTRAVENOUS
  Filled 2013-11-08: qty 1

## 2013-11-08 MED ORDER — DICYCLOMINE HCL 10 MG/ML IM SOLN
20.0000 mg | Freq: Once | INTRAMUSCULAR | Status: AC
  Administered 2013-11-08: 20 mg via INTRAMUSCULAR
  Filled 2013-11-08: qty 2

## 2013-11-08 MED ORDER — ACETAMINOPHEN 650 MG RE SUPP: 650.0000 mg | Freq: Four times a day (QID) | RECTAL | Status: DC | PRN

## 2013-11-08 MED ORDER — HYDROMORPHONE HCL PF 1 MG/ML IJ SOLN
1.0000 mg | Freq: Once | INTRAMUSCULAR | Status: AC
Start: 2013-11-08 — End: 2013-11-08
  Administered 2013-11-08: 1 mg via INTRAVENOUS
  Filled 2013-11-08: qty 1

## 2013-11-08 MED ORDER — CIPROFLOXACIN IN D5W 400 MG/200ML IV SOLN
400.0000 mg | Freq: Two times a day (BID) | INTRAVENOUS | Status: DC
  Administered 2013-11-08 – 2013-11-10 (×4): 400 mg via INTRAVENOUS
  Filled 2013-11-08 (×4): qty 200

## 2013-11-08 MED ORDER — DEXTROSE-NACL 5-0.45 % IV SOLN: INTRAVENOUS | Status: DC

## 2013-11-08 MED ORDER — IOHEXOL 300 MG/ML  SOLN
100.0000 mL | Freq: Once | INTRAMUSCULAR | Status: AC | PRN
  Administered 2013-11-08: 100 mL via INTRAVENOUS

## 2013-11-08 MED ORDER — ACETAMINOPHEN 325 MG PO TABS: 650.0000 mg | ORAL_TABLET | Freq: Four times a day (QID) | ORAL | Status: DC | PRN

## 2013-11-08 MED ORDER — HYDROMORPHONE HCL PF 1 MG/ML IJ SOLN: 1.0000 mg | INTRAMUSCULAR | Status: DC | PRN

## 2013-11-08 MED ORDER — ALBUTEROL SULFATE (2.5 MG/3ML) 0.083% IN NEBU: 2.5000 mg | INHALATION_SOLUTION | RESPIRATORY_TRACT | Status: DC | PRN

## 2013-11-08 MED ORDER — METRONIDAZOLE IN NACL 5-0.79 MG/ML-% IV SOLN
500.0000 mg | Freq: Three times a day (TID) | INTRAVENOUS | Status: DC
  Administered 2013-11-08 – 2013-11-10 (×6): 500 mg via INTRAVENOUS
  Filled 2013-11-08 (×6): qty 100

## 2013-11-08 MED ORDER — CIPROFLOXACIN IN D5W 400 MG/200ML IV SOLN
400.0000 mg | Freq: Once | INTRAVENOUS | Status: AC
  Administered 2013-11-08: 400 mg via INTRAVENOUS
  Filled 2013-11-08: qty 200

## 2013-11-08 MED ORDER — IOHEXOL 300 MG/ML  SOLN
50.0000 mL | Freq: Once | INTRAMUSCULAR | Status: AC | PRN
  Administered 2013-11-08: 50 mL via ORAL

## 2013-11-08 NOTE — ED Notes (Addendum)
Call to 3rd floor, RN to call me back
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
## 2013-11-08 NOTE — ED Notes (Signed)
Pain in lower abd and now mostly to left lower abd, describes as dull with occasional sharp and cramping feeling, denies n/v/d

## 2013-11-08 NOTE — ED Notes (Signed)
Dr. Alvino Chapel will assess pt, will be able to go home as pain is better controlled

## 2013-11-08 NOTE — Progress Notes (Signed)
Attempted to call RN from ED back.  No answer in the ED x 2 calls placed.
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
## 2013-11-09 LAB — CBC
HEMATOCRIT: 41 % (ref 39.0–52.0)
Hemoglobin: 13.9 g/dL (ref 13.0–17.0)
MCH: 31.7 pg (ref 26.0–34.0)
MCHC: 33.9 g/dL (ref 30.0–36.0)
MCV: 93.4 fL (ref 78.0–100.0)
PLATELETS: 275 10*3/uL (ref 150–400)
RBC: 4.39 MIL/uL (ref 4.22–5.81)
RDW: 12.2 % (ref 11.5–15.5)
WBC: 11.7 10*3/uL — AB (ref 4.0–10.5)

## 2013-11-09 LAB — COMPREHENSIVE METABOLIC PANEL
ALBUMIN: 3.1 g/dL — AB (ref 3.5–5.2)
ALK PHOS: 66 U/L (ref 39–117)
ALT: 19 U/L (ref 0–53)
AST: 18 U/L (ref 0–37)
BUN: 7 mg/dL (ref 6–23)
CO2: 27 mEq/L (ref 19–32)
Calcium: 9 mg/dL (ref 8.4–10.5)
Chloride: 99 mEq/L (ref 96–112)
Creatinine, Ser: 0.94 mg/dL (ref 0.50–1.35)
GFR calc Af Amer: >90 mL/min (ref >90)
GFR calc non Af Amer: >90 mL/min (ref >90)
Glucose, Bld: 133 mg/dL — ABNORMAL HIGH (ref 70–99)
Potassium: 3.9 mEq/L (ref 3.7–5.3)
SODIUM: 136 meq/L — AB (ref 137–147)
TOTAL PROTEIN: 6.7 g/dL (ref 6.0–8.3)
Total Bilirubin: 0.8 mg/dL (ref 0.3–1.2)

## 2013-11-09 LAB — HEMOGLOBIN A1C
Hgb A1c MFr Bld: 5.3 % (ref <5.7)
Mean Plasma Glucose: 105 mg/dL (ref <117)

## 2013-11-09 NOTE — Progress Notes (Signed)
Utilization Review Complete  

## 2013-11-09 NOTE — Progress Notes (Signed)
11/09/13 1509 Patient tolerating full liquids with no complaints. Notified Dr. Caryn Section. Orders placed to advance to heart healthy diet per MD. Nursing to monitor. Donavan Foil, RN

## 2013-11-09 NOTE — Progress Notes (Signed)
[facility] PROGRESS NOTE  [name] LFY:[id number] DOB: [birth year] DOA: 11/08/2013 PCP: [name]    Code Status: Full code Family Communication: Discussed with wife Disposition Plan: Anticipate discharge to home if clinically improved tomorrow.   Consultants:  None  Procedures:  None  Antibiotics:  Cipro 11/08/2013 >>  Metronidazole 11/08/2013>>  HPI/subjective:  The patient has less abdominal swelling, but he still has some left lower quadrant pain, overall less. No bowel movement overnight. No nausea vomiting. He is tolerating clear liquids.  Objective: Filed Vitals:   11/09/13 1552  BP: 116/66  Pulse: 68  Temp: 97.4 F (36.3 C)  Resp: 18    Intake/Output Summary (Last 24 hours) at 11/09/13 1707 Last data filed at 11/09/13 1339  Gross per 24 hour  Intake 2027.75 ml  Output      0 ml  Net 2027.75 ml   Filed Weights   11/08/13 0107 11/08/13 0929  Weight: 92.987 kg (205 lb) 94.8 kg (208 lb 15.9 oz)    Exam:   General:  [name], in no acute distress.  Cardiovascular: S1, S2, with no murmurs rubs or gallops.  Respiratory: Clear to auscultation bilaterally.  Abdomen: Positive bowel sounds, soft, mildly tender at the left lower quadrant, less protuberance.  Musculoskeletal: No acute hot red joints.   Data Reviewed: Basic Metabolic Panel:  Recent Labs Lab 11/08/13 0126 11/09/13 0608  NA 136* 136*  K 4.0 3.9  CL 100 99  CO2 24 27  GLUCOSE 118* 133*  BUN 17 7  CREATININE 0.96 0.94  CALCIUM 9.3 9.0   Liver Function Tests:  Recent Labs Lab 11/08/13 0126 11/09/13 0608  AST 21 18  ALT 25 19  ALKPHOS 73 66  BILITOT 0.4 0.8  PROT 7.1 6.7  ALBUMIN 3.7 3.1*    Recent Labs Lab 11/08/13 0126  LIPASE 17   No results found for this basename: AMMONIA,  in the last 168 hours CBC:  Recent Labs Lab 11/08/13 0126 11/09/13 0608  WBC 10.3 11.7*  NEUTROABS 6.3  --   HGB 13.9 13.9   HCT 39.9 41.0  MCV 91.9 93.4  PLT 251 275   Cardiac Enzymes: No results found for this basename: CKTOTAL, CKMB, CKMBINDEX, TROPONINI,  in the last 168 hours BNP (last 3 results) No results found for this basename: PROBNP,  in the last 8760 hours CBG: No results found for this basename: GLUCAP,  in the last 168 hours  No results found for this or any previous visit (from the past 240 hour(s)).   Studies: Ct Abdomen Pelvis W Contrast  11/08/2013   CLINICAL DATA:  Left lower quadrant abdominal pain. Left flank pain.  EXAM: CT ABDOMEN AND PELVIS WITH CONTRAST  TECHNIQUE: Multidetector CT imaging of the abdomen and pelvis was performed using the standard protocol following bolus administration of intravenous contrast.  CONTRAST:  20mL OMNIPAQUE IOHEXOL 300 MG/ML SOLN, 189mL OMNIPAQUE IOHEXOL 300 MG/ML SOLN  COMPARISON:  DG ABD ACUTE W/CHEST dated 11/08/2013; CT ABDOMEN W/CM dated 05/25/2007  FINDINGS: Linear atelectasis in the lung bases.  The liver, spleen, gallbladder, pancreas, adrenal glands, kidneys, abdominal aorta, inferior vena cava, and retroperitoneal lymph nodes are unremarkable. Stomach and small bowel are not abnormally distended. No free air or free fluid in the abdomen. Stool-filled colon without distention.  Pelvis: There is inflammatory infiltration in the upper the descending colon at the splenic flexure and pericolonic fat associated with diverticula. This likely represents descending colonic diverticulitis. No abscess.  Mild prostate enlargement. Bladder wall is not thickened. No free or loculated pelvic fluid collections. The appendix is normal. No significant lymphadenopathy in the pelvis. No destructive bone lesions.  IMPRESSION: Inflammatory changes along the descending colon with diverticula consistent with diverticulitis. No abscess.   Electronically Signed   By: [name] M.D.   On: 11/08/2013 06:10   Dg Abd Acute W/chest  11/08/2013   CLINICAL DATA:  Left-sided abdominal  pain. History of diverticulitis.  EXAM: ACUTE ABDOMEN SERIES (ABDOMEN 2 VIEW & CHEST 1 VIEW)  COMPARISON:  CT ABDOMEN W/CM dated 05/25/2007; DG ABD ACUTE W/CHEST dated 05/25/2007  FINDINGS: There is no evidence of dilated bowel loops or free intraperitoneal air. No radiopaque calculi or other significant radiographic abnormality is seen. Heart size and mediastinal contours are within normal limits. Both lungs are clear.  IMPRESSION: Negative abdominal radiographs.  No acute cardiopulmonary disease.   Electronically Signed   By: [name] M.D.   On: 11/08/2013 03:13    Scheduled Meds: . ciprofloxacin  400 mg Intravenous Q12H  . lisinopril  10 mg Oral Daily  . metronidazole  500 mg Intravenous Q8H   Continuous Infusions: . 0.9 % NaCl with KCl 20 mEq / L 75 mL/hr at 11/09/13 1104    Principal Problem:   Acute diverticulitis Active Problems:   Tobacco use disorder   HTN (hypertension)  He has improved symptomatically. We'll continue current management with IV fluids, Cipro, and metronidazole and as needed analgesics. Will advance his diet as tolerated. We'll decrease the IV fluids a little. Anticipate discharge to home tomorrow if he continues to improve clinically.   Time spent: 25 minutes    [name]  [facility] Pager [phone] If 7PM-7AM, please contact night-coverage at [email or web address], password [facility] 11/09/2013, 5:07 PM  LOS: 1 day

## 2013-11-10 LAB — BASIC METABOLIC PANEL
BUN: 7 mg/dL (ref 6–23)
CO2: 27 meq/L (ref 19–32)
Calcium: 9.1 mg/dL (ref 8.4–10.5)
Chloride: 103 mEq/L (ref 96–112)
Creatinine, Ser: 0.92 mg/dL (ref 0.50–1.35)
GFR calc Af Amer: >90 mL/min (ref >90)
GLUCOSE: 112 mg/dL — AB (ref 70–99)
POTASSIUM: 4.4 meq/L (ref 3.7–5.3)
Sodium: 139 mEq/L (ref 137–147)

## 2013-11-10 MED ORDER — METRONIDAZOLE 500 MG PO TABS: 500.0000 mg | ORAL_TABLET | Freq: Three times a day (TID) | ORAL | Status: DC

## 2013-11-10 MED ORDER — CIPROFLOXACIN HCL 500 MG PO TABS: 500.0000 mg | ORAL_TABLET | Freq: Two times a day (BID) | ORAL | Status: DC

## 2013-11-10 MED ORDER — HYDROCODONE-ACETAMINOPHEN 5-500 MG PO TABS: 1.0000 | ORAL_TABLET | Freq: Four times a day (QID) | ORAL | Status: DC | PRN

## 2013-11-10 MED ORDER — DOCUSATE SODIUM 100 MG PO CAPS: 100.0000 mg | ORAL_CAPSULE | Freq: Two times a day (BID) | ORAL | Status: DC

## 2013-11-10 NOTE — Discharge Summary (Signed)
Physician Discharge Summary  [name] KNL:[id number] DOB: 1970/07/26 DOA: 11/08/2013  PCP: Wynelle Fanny  Admit date: 11/08/2013 Discharge date: 11/10/2013  Time spent: Greater than 30 minutes  Recommendations for Outpatient Follow-up:   Discharge Diagnoses:  1. Acute diverticulitis. 2. Tobacco abuse. 3. Hypertension. 4. Mild hyperglycemia. Hemoglobin A1c 5.3.  Discharge Condition: Improved.  Diet recommendation: Heart healthy.  Filed Weights   11/08/13 0107 11/08/13 0929  Weight: 92.987 kg (205 lb) 94.8 kg (208 lb 15.9 oz)    History of present illness:  The patient is a 43 year old man with a history of diverticulitis, C. difficile colitis, hypertension, and hyperlipidemia. He presented to the emergency Department on 11/08/2013 with a chief complaint of abdominal pain. In the emergency department, he was afebrile and hemodynamically stable. His lab data were virtually unremarkable. CT of his abdomen and pelvis revealed inflammatory changes along the descending colon with diverticula, consistent with diverticulitis; no abscess. He was admitted for further evaluation and management.  Hospital Course:  The patient was started on Cipro and Flagyl in the emergency department. They were continued. Gentle IV fluids were started. His pain was treated with as needed IV hydromorphone. Nausea was ordered if needed for nausea. He was started on a clear liquid diet. Zestoretic was withheld, but he was restarted on Prinivil. He was encouraged to stop smoking cigars. He declined a nicotine patch. Smoking cessation counseling was ordered. His venous glucose was noted to be mildly elevated. Hemoglobin A1c was ordered and it was within normal limits. His venous glucose trended downward.  Over the course of the hospitalization, the patient's pain subsided and nearly resolved. His diet was advanced slowly. He tolerated the advancement well. He remained afebrile. His white blood cell count  remained within normal limits. He received 2 days of IV Cipro and Flagyl. He was discharged on 8 more days of oral Cipro and Flagyl.    Procedures:  None  Consultations:  None  Discharge Exam: Filed Vitals:   11/10/13 0536  BP: 109/70  Pulse: 60  Temp: 97.9 F (36.6 C)  Resp: 20    General: Pleasant African-American man in no acute distress. Cardiovascular: S1, S2, with no murmurs rubs or gallops. Respiratory: Clear to auscultation bilaterally. Abdomen: Positive bowel sounds, soft, very minimal tenderness in the left lower quadrant; no distention or guarding.  Discharge Instructions You were cared for by a hospitalist during your hospital stay. If you have any questions about your discharge medications or the care you received while you were in the hospital after you are discharged, you can call the unit and asked to speak with the hospitalist on call if the hospitalist that took care of you is not available. Once you are discharged, your primary care physician will handle any further medical issues. Please note that NO REFILLS for any discharge medications will be authorized once you are discharged, as it is imperative that you return to your primary care physician (or establish a relationship with a primary care physician if you do not have one) for your aftercare needs so that they can reassess your need for medications and monitor your lab values.  Discharge Orders   Future Orders Complete By Expires   Diet - low sodium heart healthy  As directed    Discharge instructions  As directed    Increase activity slowly  As directed        Medication List         ciprofloxacin 500 MG tablet  Commonly known  as:  CIPRO  Take 1 tablet (500 mg total) by mouth 2 (two) times daily.     docusate sodium 100 MG capsule  Commonly known as:  COLACE  Take 1 capsule (100 mg total) by mouth 2 (two) times daily.     HYDROcodone-acetaminophen 5-500 MG per tablet  Commonly known as:   VICODIN  Take 1 tablet by mouth every 6 (six) hours as needed for pain.     lisinopril-hydrochlorothiazide 10-12.5 MG per tablet  Commonly known as:  ZESTORETIC  Take 1 tablet by mouth daily.     metroNIDAZOLE 500 MG tablet  Commonly known as:  FLAGYL  Take 1 tablet (500 mg total) by mouth 3 (three) times daily.     pravastatin 20 MG tablet  Commonly known as:  PRAVACHOL  Take 1 tablet (20 mg total) by mouth daily.       No Known Allergies     Follow-up Information   Follow up with WILLARD,JENNIFER, PA-C. (As needed)    Specialty:  Family Medicine   Contact information:   [address] [place] [address] [phone]        The results of significant diagnostics from this hospitalization (including imaging, microbiology, ancillary and laboratory) are listed below for reference.    Significant Diagnostic Studies: Ct Abdomen Pelvis W Contrast  11/08/2013   CLINICAL DATA:  Left lower quadrant abdominal pain. Left flank pain.  EXAM: CT ABDOMEN AND PELVIS WITH CONTRAST  TECHNIQUE: Multidetector CT imaging of the abdomen and pelvis was performed using the standard protocol following bolus administration of intravenous contrast.  CONTRAST:  92mL OMNIPAQUE IOHEXOL 300 MG/ML SOLN, 180mL OMNIPAQUE IOHEXOL 300 MG/ML SOLN  COMPARISON:  DG ABD ACUTE W/CHEST dated 11/08/2013; CT ABDOMEN W/CM dated 05/25/2007  FINDINGS: Linear atelectasis in the lung bases.  The liver, spleen, gallbladder, pancreas, adrenal glands, kidneys, abdominal aorta, inferior vena cava, and retroperitoneal lymph nodes are unremarkable. Stomach and small bowel are not abnormally distended. No free air or free fluid in the abdomen. Stool-filled colon without distention.  Pelvis: There is inflammatory infiltration in the upper the descending colon at the splenic flexure and pericolonic fat associated with diverticula. This likely represents descending colonic diverticulitis. No abscess.  Mild prostate  enlargement. Bladder wall is not thickened. No free or loculated pelvic fluid collections. The appendix is normal. No significant lymphadenopathy in the pelvis. No destructive bone lesions.  IMPRESSION: Inflammatory changes along the descending colon with diverticula consistent with diverticulitis. No abscess.   Electronically Signed   By: Lucienne Capers M.D.   On: 11/08/2013 06:10   Dg Abd Acute W/chest  11/08/2013   CLINICAL DATA:  Left-sided abdominal pain. History of diverticulitis.  EXAM: ACUTE ABDOMEN SERIES (ABDOMEN 2 VIEW & CHEST 1 VIEW)  COMPARISON:  CT ABDOMEN W/CM dated 05/25/2007; DG ABD ACUTE W/CHEST dated 05/25/2007  FINDINGS: There is no evidence of dilated bowel loops or free intraperitoneal air. No radiopaque calculi or other significant radiographic abnormality is seen. Heart size and mediastinal contours are within normal limits. Both lungs are clear.  IMPRESSION: Negative abdominal radiographs.  No acute cardiopulmonary disease.   Electronically Signed   By: Lucienne Capers M.D.   On: 11/08/2013 03:13    Microbiology: No results found for this or any previous visit (from the past 240 hour(s)).   Labs: Basic Metabolic Panel:  Recent Labs Lab 11/08/13 0126 11/09/13 0608 11/10/13 0607  NA 136* 136* 139  K 4.0 3.9 4.4  CL  100 99 103  CO2 24 27 27   GLUCOSE 118* 133* 112*  BUN 17 7 7   CREATININE 0.96 0.94 0.92  CALCIUM 9.3 9.0 9.1   Liver Function Tests:  Recent Labs Lab 11/08/13 0126 11/09/13 0608  AST 21 18  ALT 25 19  ALKPHOS 73 66  BILITOT 0.4 0.8  PROT 7.1 6.7  ALBUMIN 3.7 3.1*    Recent Labs Lab 11/08/13 0126  LIPASE 17   No results found for this basename: AMMONIA,  in the last 168 hours CBC:  Recent Labs Lab 11/08/13 0126 11/09/13 0608  WBC 10.3 11.7*  NEUTROABS 6.3  --   HGB 13.9 13.9  HCT 39.9 41.0  MCV 91.9 93.4  PLT 251 275   Cardiac Enzymes: No results found for this basename: CKTOTAL, CKMB, CKMBINDEX, TROPONINI,  in the last  168 hours BNP: BNP (last 3 results) No results found for this basename: PROBNP,  in the last 8760 hours CBG: No results found for this basename: GLUCAP,  in the last 168 hours     Signed:  Rexene Alberts  [facility] 11/10/2013, 9:14 AM

## 2013-11-10 NOTE — Progress Notes (Signed)
Pt discharged home via wheelchair.  Pt given prescriptions x3 with discharge instructions.  IV removed without complications.  Pt verbalized understanding of discharge instructions.

## 2014-04-22 ENCOUNTER — Telehealth: Payer: Self-pay | Admitting: Interventional Cardiology

## 2014-04-22 NOTE — Telephone Encounter (Signed)
New message     Pt need to know the first day he say Dr Clayton Bibles from [place] computer

## 2014-04-23 NOTE — Telephone Encounter (Signed)
Pt requesting date he first saw Dr Irish Lack when he was with [facility]. Per Glenard Haring W-pt should call [facility] [phone] to get information.

## 2014-04-23 NOTE — Telephone Encounter (Signed)
Pt advised.

## 2014-08-30 ENCOUNTER — Other Ambulatory Visit: Payer: Self-pay | Admitting: *Deleted

## 2014-08-30 MED ORDER — PRAVASTATIN SODIUM 20 MG PO TABS: 20.0000 mg | ORAL_TABLET | Freq: Every day | ORAL | Status: DC

## 2014-08-31 ENCOUNTER — Other Ambulatory Visit: Payer: Self-pay

## 2014-08-31 ENCOUNTER — Other Ambulatory Visit: Payer: Self-pay | Admitting: Interventional Cardiology

## 2014-08-31 MED ORDER — LISINOPRIL-HYDROCHLOROTHIAZIDE 10-12.5 MG PO TABS: 1.0000 | ORAL_TABLET | Freq: Every day | ORAL | Status: DC

## 2014-08-31 NOTE — Telephone Encounter (Deleted)
Patient needs to make appointment to come in before more refills are approved.

## 2014-10-27 ENCOUNTER — Other Ambulatory Visit: Payer: Self-pay | Admitting: Interventional Cardiology

## 2014-11-11 ENCOUNTER — Other Ambulatory Visit: Payer: Self-pay | Admitting: Interventional Cardiology

## 2014-11-29 ENCOUNTER — Other Ambulatory Visit: Payer: Self-pay | Admitting: Interventional Cardiology

## 2014-12-03 ENCOUNTER — Other Ambulatory Visit: Payer: Self-pay | Admitting: Family Medicine

## 2014-12-03 DIAGNOSIS — N63 Unspecified lump in unspecified breast: Secondary | ICD-10-CM

## 2014-12-09 ENCOUNTER — Ambulatory Visit
Admission: RE | Admit: 2014-12-09 | Discharge: 2014-12-09 | Disposition: A | Discharge status: Home / Self Care | Payer: BLUE CROSS/BLUE SHIELD | Source: Ambulatory Visit | Attending: Family Medicine | Admitting: Family Medicine

## 2014-12-09 DIAGNOSIS — N63 Unspecified lump in unspecified breast: Secondary | ICD-10-CM

## 2014-12-11 ENCOUNTER — Other Ambulatory Visit: Payer: Self-pay | Admitting: Interventional Cardiology

## 2014-12-20 ENCOUNTER — Other Ambulatory Visit: Payer: Self-pay | Admitting: Interventional Cardiology

## 2015-01-03 ENCOUNTER — Ambulatory Visit (INDEPENDENT_AMBULATORY_CARE_PROVIDER_SITE_OTHER): Payer: BLUE CROSS/BLUE SHIELD | Admitting: Interventional Cardiology

## 2015-01-03 ENCOUNTER — Encounter: Payer: Self-pay | Admitting: Interventional Cardiology

## 2015-01-03 VITALS — BP 124/86 | HR 69 | Ht 72.0 in | Wt 201.8 lb

## 2015-01-03 DIAGNOSIS — F17200 Nicotine dependence, unspecified, uncomplicated: Secondary | ICD-10-CM

## 2015-01-03 DIAGNOSIS — E785 Hyperlipidemia, unspecified: Secondary | ICD-10-CM | POA: Insufficient documentation

## 2015-01-03 DIAGNOSIS — I359 Nonrheumatic aortic valve disorder, unspecified: Secondary | ICD-10-CM | POA: Diagnosis not present

## 2015-01-03 DIAGNOSIS — Z720 Tobacco use: Secondary | ICD-10-CM | POA: Diagnosis not present

## 2015-01-03 DIAGNOSIS — I10 Essential (primary) hypertension: Secondary | ICD-10-CM

## 2015-01-03 DIAGNOSIS — E782 Mixed hyperlipidemia: Secondary | ICD-10-CM

## 2015-01-03 MED ORDER — LISINOPRIL-HYDROCHLOROTHIAZIDE 10-12.5 MG PO TABS: 1.0000 | ORAL_TABLET | Freq: Every day | ORAL | Status: DC

## 2015-01-03 MED ORDER — PRAVASTATIN SODIUM 20 MG PO TABS: 20.0000 mg | ORAL_TABLET | Freq: Every day | ORAL | Status: DC

## 2015-01-03 NOTE — Patient Instructions (Signed)
Medication Instructions:  Same-no change  Labwork: None  Testing/Procedures: None  Follow-Up: Your physician wants you to follow-up in: 1 year. You will receive a reminder letter in the mail two months in advance. If you don't receive a letter, please call our office to schedule the follow-up appointment.      
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
## 2015-01-14 ENCOUNTER — Other Ambulatory Visit: Payer: Self-pay | Admitting: Interventional Cardiology

## 2015-01-28 ENCOUNTER — Other Ambulatory Visit: Payer: Self-pay | Admitting: Interventional Cardiology

## 2015-05-26 IMAGING — CR DG ABDOMEN ACUTE W/ 1V CHEST
4 series · 4 of 4 positions shown · non-contrast
Comparison: CT ABDOMEN W/CM dated 05/25/2007; DG ABD ACUTE W/CHEST
dated 05/25/2007

CLINICAL DATA: Left-sided abdominal pain. History of
diverticulitis.

EXAM:
ACUTE ABDOMEN SERIES (ABDOMEN 2 VIEW & CHEST 1 VIEW)

[view not recorded (1 of 4)]
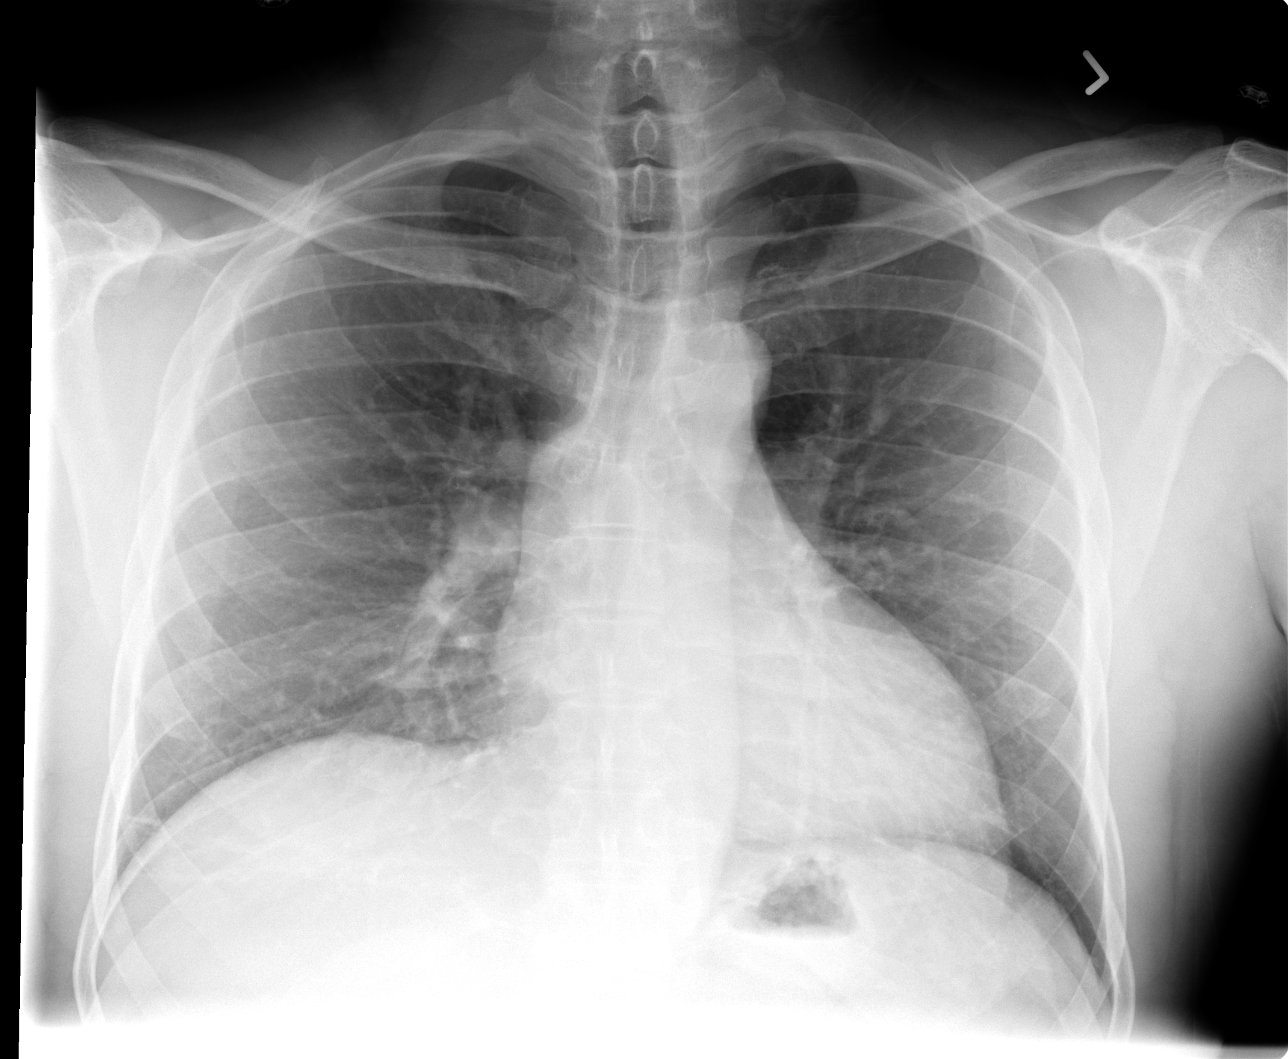

[view not recorded (2 of 4)]
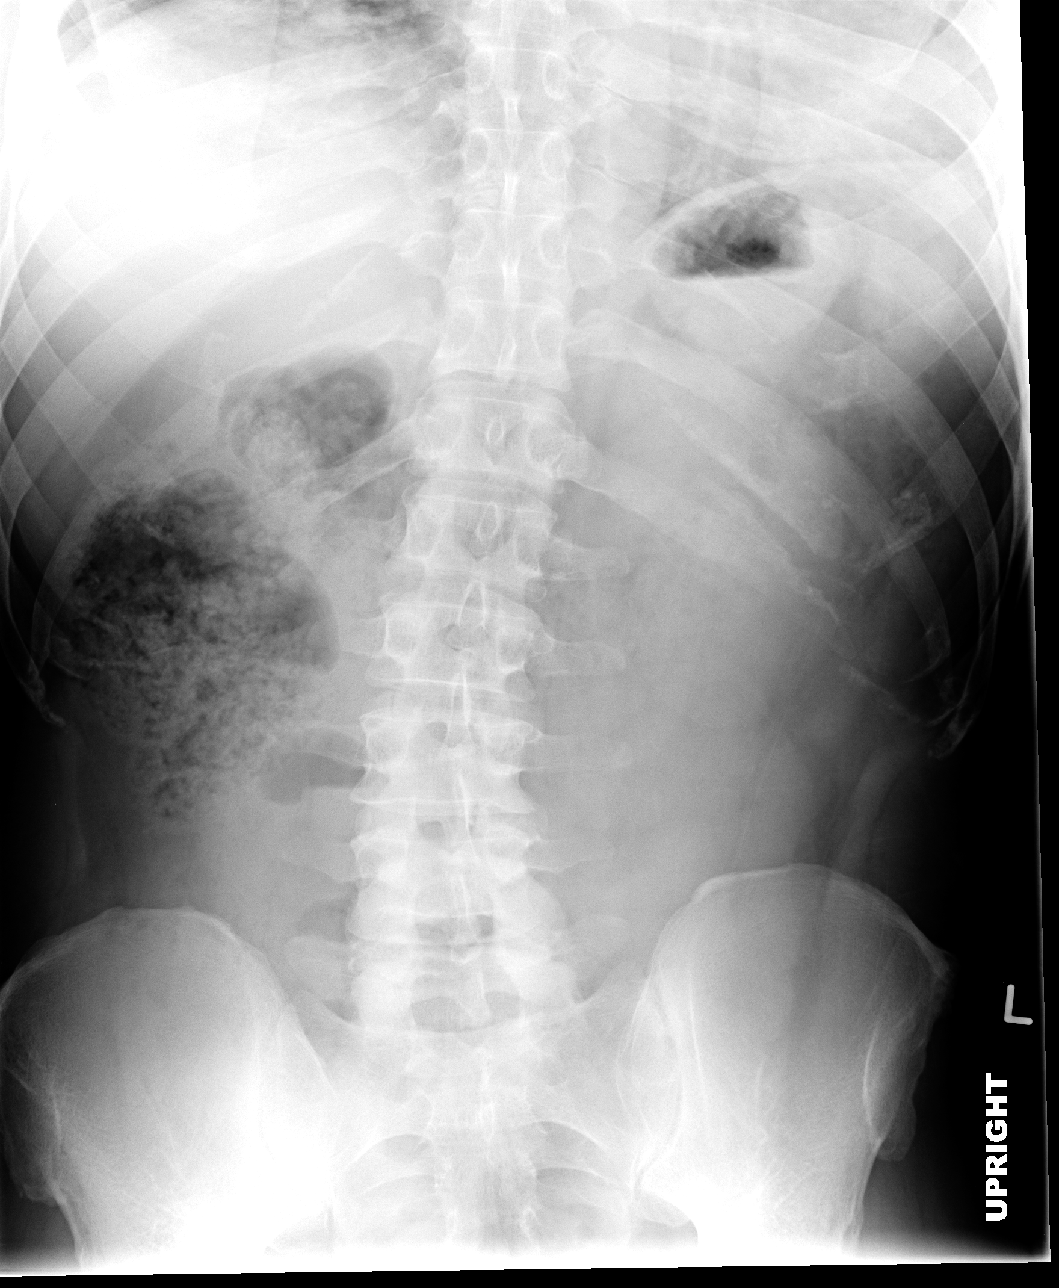

[view not recorded (3 of 4)]
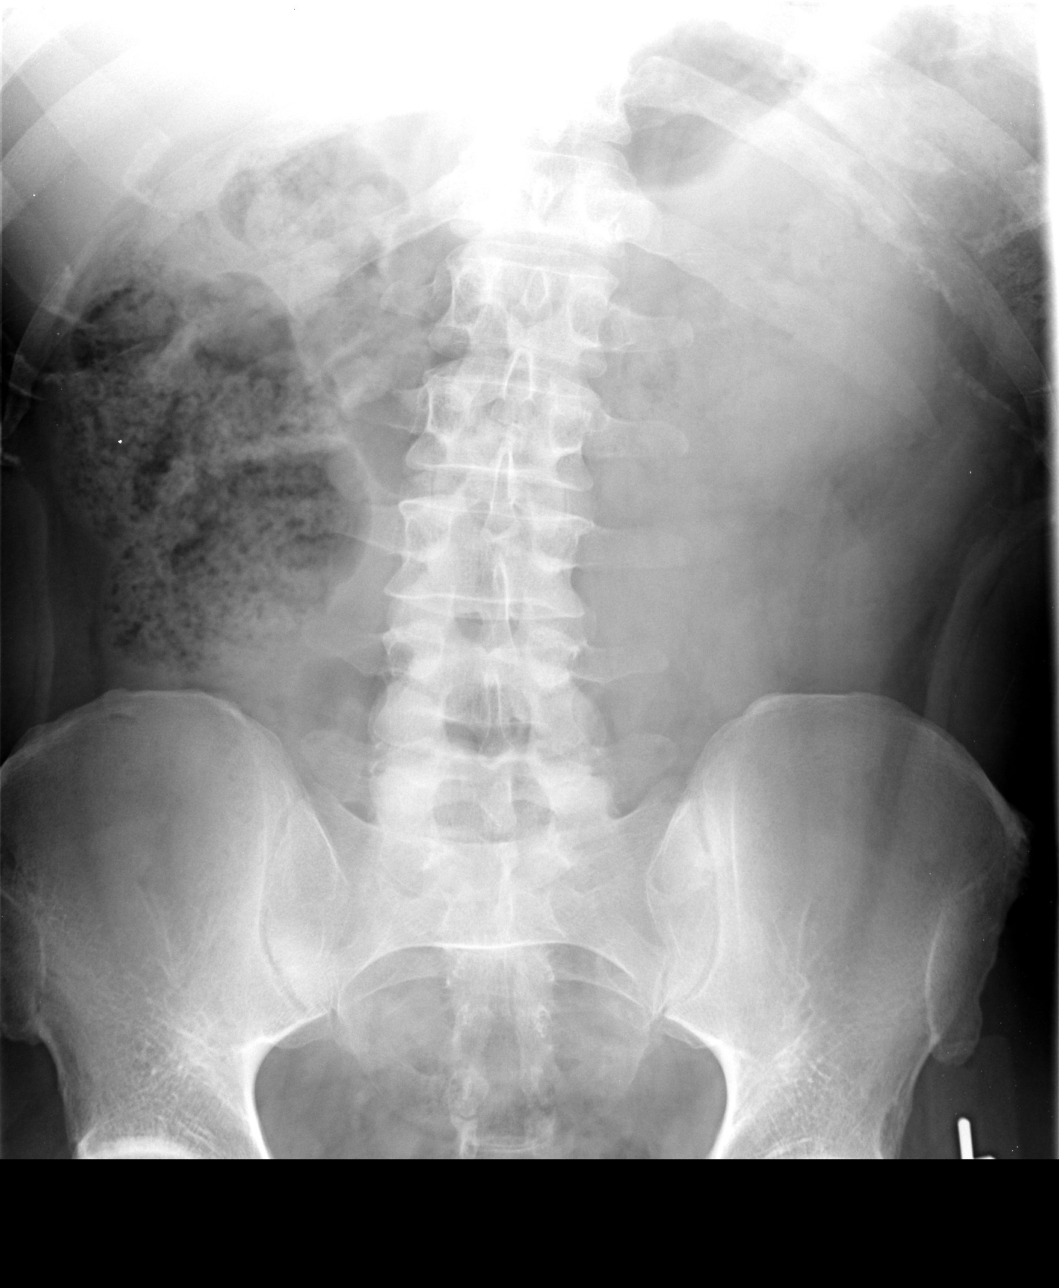

[view not recorded (4 of 4)]
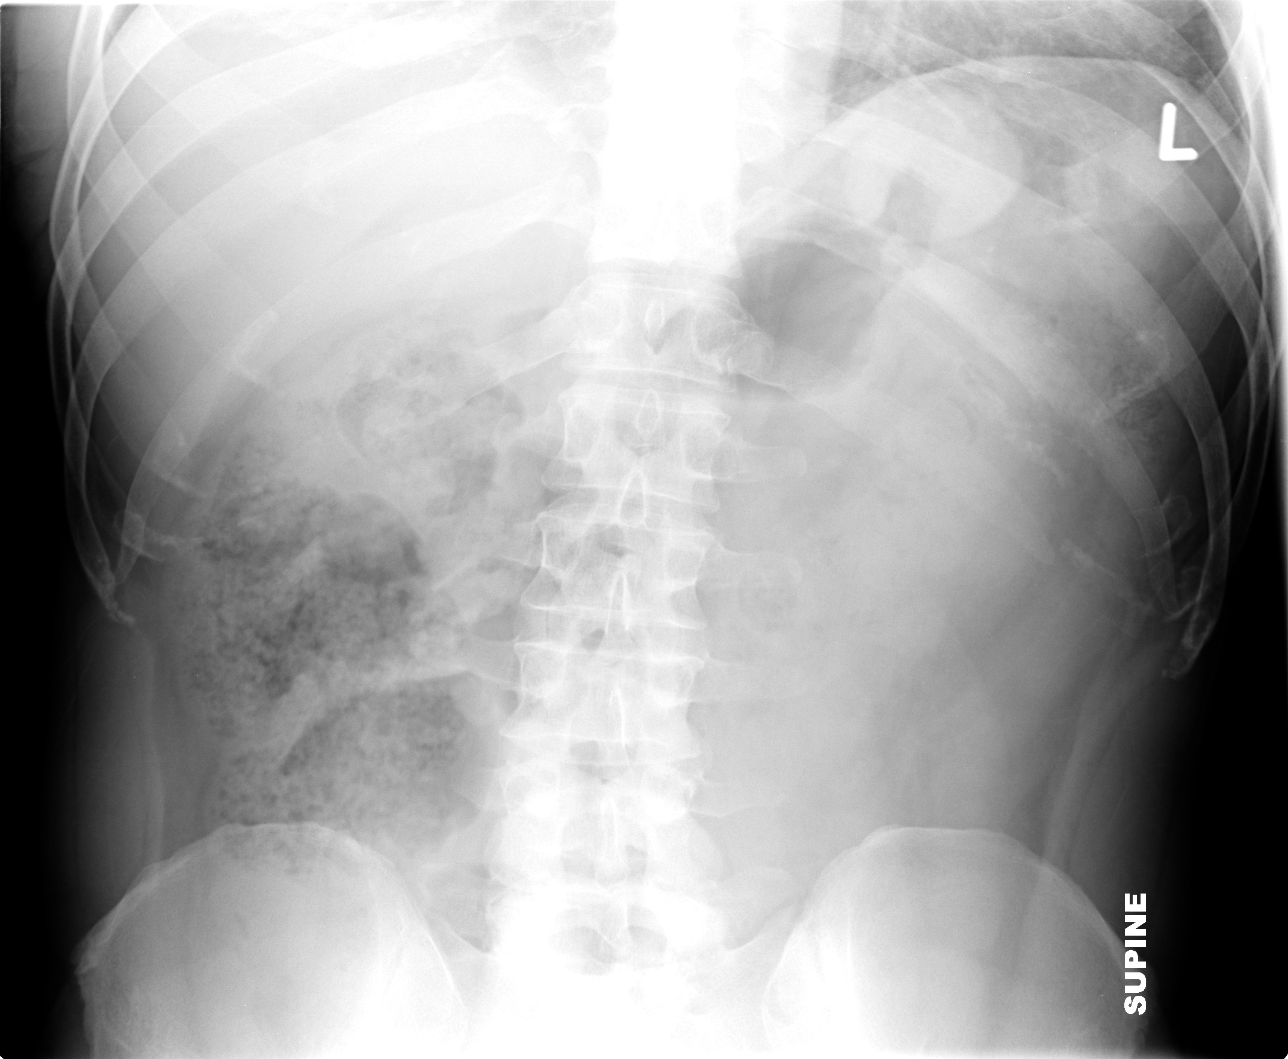

[4 of 4 positions shown; findings below may reference images not displayed]

FINDINGS: There is no evidence of dilated bowel loops or free intraperitoneal
air. No radiopaque calculi or other significant radiographic
abnormality is seen. Heart size and mediastinal contours are within
normal limits. Both lungs are clear.
IMPRESSION: Negative abdominal radiographs.  No acute cardiopulmonary disease.

## 2015-07-10 HISTORY — PX: LIPOMA EXCISION: SHX5283

## 2015-10-11 DIAGNOSIS — R7309 Other abnormal glucose: Secondary | ICD-10-CM | POA: Diagnosis not present

## 2015-10-11 DIAGNOSIS — M5481 Occipital neuralgia: Secondary | ICD-10-CM | POA: Diagnosis not present

## 2015-10-11 DIAGNOSIS — Z13220 Encounter for screening for lipoid disorders: Secondary | ICD-10-CM | POA: Diagnosis not present

## 2015-10-11 DIAGNOSIS — R590 Localized enlarged lymph nodes: Secondary | ICD-10-CM | POA: Diagnosis not present

## 2015-11-04 DIAGNOSIS — R221 Localized swelling, mass and lump, neck: Secondary | ICD-10-CM | POA: Diagnosis not present

## 2015-11-04 DIAGNOSIS — J3489 Other specified disorders of nose and nasal sinuses: Secondary | ICD-10-CM | POA: Diagnosis not present

## 2015-11-04 DIAGNOSIS — J342 Deviated nasal septum: Secondary | ICD-10-CM | POA: Diagnosis not present

## 2015-11-04 DIAGNOSIS — R590 Localized enlarged lymph nodes: Secondary | ICD-10-CM | POA: Diagnosis not present

## 2015-11-08 DIAGNOSIS — R221 Localized swelling, mass and lump, neck: Secondary | ICD-10-CM | POA: Diagnosis not present

## 2015-11-08 DIAGNOSIS — R590 Localized enlarged lymph nodes: Secondary | ICD-10-CM | POA: Diagnosis not present

## 2015-11-15 ENCOUNTER — Other Ambulatory Visit: Payer: Self-pay | Admitting: Otolaryngology

## 2015-11-15 DIAGNOSIS — R221 Localized swelling, mass and lump, neck: Secondary | ICD-10-CM

## 2015-11-16 ENCOUNTER — Ambulatory Visit
Admission: RE | Admit: 2015-11-16 | Discharge: 2015-11-16 | Disposition: A | Discharge status: Home / Self Care | Payer: BLUE CROSS/BLUE SHIELD | Source: Ambulatory Visit | Attending: Otolaryngology | Admitting: Otolaryngology

## 2015-11-16 ENCOUNTER — Other Ambulatory Visit: Payer: Self-pay | Admitting: Otolaryngology

## 2015-11-16 DIAGNOSIS — R221 Localized swelling, mass and lump, neck: Secondary | ICD-10-CM

## 2015-11-16 DIAGNOSIS — R220 Localized swelling, mass and lump, head: Secondary | ICD-10-CM | POA: Diagnosis not present

## 2015-11-17 ENCOUNTER — Other Ambulatory Visit (HOSPITAL_COMMUNITY): Payer: Self-pay | Admitting: Otolaryngology

## 2015-11-17 DIAGNOSIS — R221 Localized swelling, mass and lump, neck: Secondary | ICD-10-CM

## 2015-11-29 ENCOUNTER — Ambulatory Visit (HOSPITAL_COMMUNITY)
Admission: RE | Admit: 2015-11-29 | Discharge: 2015-11-29 | Disposition: A | Discharge status: Home / Self Care | Payer: BLUE CROSS/BLUE SHIELD | Source: Ambulatory Visit | Attending: Otolaryngology | Admitting: Otolaryngology

## 2015-11-29 DIAGNOSIS — R221 Localized swelling, mass and lump, neck: Secondary | ICD-10-CM | POA: Insufficient documentation

## 2015-11-29 DIAGNOSIS — E041 Nontoxic single thyroid nodule: Secondary | ICD-10-CM | POA: Diagnosis not present

## 2015-11-29 DIAGNOSIS — K119 Disease of salivary gland, unspecified: Secondary | ICD-10-CM | POA: Diagnosis not present

## 2015-11-29 MED ORDER — LIDOCAINE HCL (PF) 1 % IJ SOLN
INTRAMUSCULAR | Status: AC
  Filled 2015-11-29: qty 10

## 2015-11-29 NOTE — Procedures (Signed)
US guided FNA of palpable lesion in right upper neck, below right mandible angle.  7 FNAs obtained.  Minimal blood loss and no immediate complication.

## 2015-12-14 ENCOUNTER — Other Ambulatory Visit: Payer: Self-pay | Admitting: Otolaryngology

## 2015-12-14 DIAGNOSIS — D210 Benign neoplasm of connective and other soft tissue of head, face and neck: Secondary | ICD-10-CM | POA: Diagnosis not present

## 2015-12-14 DIAGNOSIS — R221 Localized swelling, mass and lump, neck: Secondary | ICD-10-CM | POA: Diagnosis not present

## 2016-01-04 DIAGNOSIS — D219 Benign neoplasm of connective and other soft tissue, unspecified: Secondary | ICD-10-CM | POA: Diagnosis not present

## 2016-01-09 ENCOUNTER — Other Ambulatory Visit: Payer: Self-pay | Admitting: Interventional Cardiology

## 2016-02-12 ENCOUNTER — Other Ambulatory Visit: Payer: Self-pay | Admitting: Interventional Cardiology

## 2016-02-19 ENCOUNTER — Other Ambulatory Visit: Payer: Self-pay | Admitting: Interventional Cardiology

## 2016-02-26 DIAGNOSIS — M5430 Sciatica, unspecified side: Secondary | ICD-10-CM | POA: Diagnosis not present

## 2016-03-01 ENCOUNTER — Other Ambulatory Visit: Payer: Self-pay | Admitting: Interventional Cardiology

## 2016-03-01 DIAGNOSIS — D105 Benign neoplasm of other parts of oropharynx: Secondary | ICD-10-CM | POA: Diagnosis not present

## 2016-03-01 DIAGNOSIS — D219 Benign neoplasm of connective and other soft tissue, unspecified: Secondary | ICD-10-CM | POA: Insufficient documentation

## 2016-03-01 DIAGNOSIS — R221 Localized swelling, mass and lump, neck: Secondary | ICD-10-CM | POA: Insufficient documentation

## 2016-03-07 ENCOUNTER — Other Ambulatory Visit: Payer: Self-pay | Admitting: Otolaryngology

## 2016-03-07 DIAGNOSIS — R221 Localized swelling, mass and lump, neck: Secondary | ICD-10-CM

## 2016-03-12 ENCOUNTER — Other Ambulatory Visit: Payer: Self-pay | Admitting: Interventional Cardiology

## 2016-03-13 ENCOUNTER — Ambulatory Visit
Admission: RE | Admit: 2016-03-13 | Discharge: 2016-03-13 | Disposition: A | Discharge status: Home / Self Care | Payer: BLUE CROSS/BLUE SHIELD | Source: Ambulatory Visit | Attending: Otolaryngology | Admitting: Otolaryngology

## 2016-03-13 DIAGNOSIS — R221 Localized swelling, mass and lump, neck: Secondary | ICD-10-CM

## 2016-03-18 ENCOUNTER — Other Ambulatory Visit: Payer: Self-pay | Admitting: Interventional Cardiology

## 2016-04-02 ENCOUNTER — Other Ambulatory Visit: Payer: Self-pay | Admitting: Interventional Cardiology

## 2016-04-03 ENCOUNTER — Other Ambulatory Visit: Payer: Self-pay

## 2016-04-03 DIAGNOSIS — D104 Benign neoplasm of tonsil: Secondary | ICD-10-CM | POA: Diagnosis not present

## 2016-04-03 DIAGNOSIS — D105 Benign neoplasm of other parts of oropharynx: Secondary | ICD-10-CM | POA: Diagnosis not present

## 2016-04-03 DIAGNOSIS — D1039 Benign neoplasm of other parts of mouth: Secondary | ICD-10-CM | POA: Diagnosis not present

## 2016-04-03 MED ORDER — PRAVASTATIN SODIUM 20 MG PO TABS: 20.0000 mg | ORAL_TABLET | Freq: Every day | ORAL | 1 refills | Status: DC

## 2016-05-03 ENCOUNTER — Other Ambulatory Visit: Payer: Self-pay | Admitting: Interventional Cardiology

## 2016-05-09 DIAGNOSIS — D105 Benign neoplasm of other parts of oropharynx: Secondary | ICD-10-CM | POA: Diagnosis not present
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
## 2016-05-15 ENCOUNTER — Ambulatory Visit (INDEPENDENT_AMBULATORY_CARE_PROVIDER_SITE_OTHER): Payer: BLUE CROSS/BLUE SHIELD | Admitting: Interventional Cardiology

## 2016-05-15 ENCOUNTER — Encounter (INDEPENDENT_AMBULATORY_CARE_PROVIDER_SITE_OTHER): Payer: Self-pay

## 2016-05-15 ENCOUNTER — Encounter: Payer: Self-pay | Admitting: Interventional Cardiology

## 2016-05-15 VITALS — BP 128/90 | HR 77 | Ht 72.0 in | Wt 211.0 lb

## 2016-05-15 DIAGNOSIS — I359 Nonrheumatic aortic valve disorder, unspecified: Secondary | ICD-10-CM

## 2016-05-15 DIAGNOSIS — F17200 Nicotine dependence, unspecified, uncomplicated: Secondary | ICD-10-CM | POA: Diagnosis not present

## 2016-05-15 DIAGNOSIS — E782 Mixed hyperlipidemia: Secondary | ICD-10-CM

## 2016-05-15 DIAGNOSIS — I10 Essential (primary) hypertension: Secondary | ICD-10-CM | POA: Diagnosis not present

## 2016-05-15 NOTE — Patient Instructions (Signed)

## 2016-06-01 ENCOUNTER — Other Ambulatory Visit: Payer: Self-pay | Admitting: Interventional Cardiology

## 2016-06-26 DIAGNOSIS — Z8701 Personal history of pneumonia (recurrent): Secondary | ICD-10-CM | POA: Diagnosis not present

## 2016-06-26 DIAGNOSIS — Z833 Family history of diabetes mellitus: Secondary | ICD-10-CM | POA: Diagnosis not present

## 2016-06-26 DIAGNOSIS — Z8249 Family history of ischemic heart disease and other diseases of the circulatory system: Secondary | ICD-10-CM | POA: Diagnosis not present

## 2016-06-26 DIAGNOSIS — R6813 Apparent life threatening event in infant (ALTE): Secondary | ICD-10-CM | POA: Diagnosis not present

## 2016-06-27 DIAGNOSIS — R6813 Apparent life threatening event in infant (ALTE): Secondary | ICD-10-CM | POA: Diagnosis not present

## 2016-12-11 DIAGNOSIS — R1013 Epigastric pain: Secondary | ICD-10-CM | POA: Diagnosis not present

## 2016-12-12 ENCOUNTER — Other Ambulatory Visit: Payer: Self-pay | Admitting: Family Medicine

## 2016-12-12 DIAGNOSIS — R1013 Epigastric pain: Secondary | ICD-10-CM

## 2016-12-20 ENCOUNTER — Ambulatory Visit
Admission: RE | Admit: 2016-12-20 | Discharge: 2016-12-20 | Disposition: A | Discharge status: Home / Self Care | Payer: BLUE CROSS/BLUE SHIELD | Source: Ambulatory Visit | Attending: Family Medicine | Admitting: Family Medicine

## 2016-12-20 DIAGNOSIS — R1013 Epigastric pain: Secondary | ICD-10-CM | POA: Diagnosis not present

## 2016-12-25 ENCOUNTER — Other Ambulatory Visit (HOSPITAL_COMMUNITY): Payer: Self-pay | Admitting: Family Medicine

## 2016-12-25 DIAGNOSIS — R109 Unspecified abdominal pain: Secondary | ICD-10-CM

## 2017-01-03 ENCOUNTER — Ambulatory Visit (HOSPITAL_COMMUNITY)
Admission: RE | Admit: 2017-01-03 | Discharge: 2017-01-03 | Disposition: A | Discharge status: Home / Self Care | Payer: BLUE CROSS/BLUE SHIELD | Source: Ambulatory Visit | Attending: Family Medicine | Admitting: Family Medicine

## 2017-01-03 DIAGNOSIS — R109 Unspecified abdominal pain: Secondary | ICD-10-CM | POA: Diagnosis not present

## 2017-01-03 DIAGNOSIS — R1011 Right upper quadrant pain: Secondary | ICD-10-CM | POA: Diagnosis not present

## 2017-01-03 MED ORDER — TECHNETIUM TC 99M MEBROFENIN IV KIT
5.1000 | PACK | Freq: Once | INTRAVENOUS | Status: AC | PRN
  Administered 2017-01-03: 5.1 via INTRAVENOUS

## 2017-01-07 ENCOUNTER — Encounter: Payer: Self-pay | Admitting: Gastroenterology

## 2017-02-27 ENCOUNTER — Encounter (INDEPENDENT_AMBULATORY_CARE_PROVIDER_SITE_OTHER): Payer: Self-pay

## 2017-02-27 ENCOUNTER — Encounter: Payer: Self-pay | Admitting: Gastroenterology

## 2017-02-27 ENCOUNTER — Ambulatory Visit (INDEPENDENT_AMBULATORY_CARE_PROVIDER_SITE_OTHER): Payer: BLUE CROSS/BLUE SHIELD | Admitting: Gastroenterology

## 2017-02-27 VITALS — BP 118/80 | HR 77 | Ht 72.0 in | Wt 198.6 lb

## 2017-02-27 DIAGNOSIS — Z8719 Personal history of other diseases of the digestive system: Secondary | ICD-10-CM | POA: Diagnosis not present

## 2017-02-27 DIAGNOSIS — R1013 Epigastric pain: Secondary | ICD-10-CM

## 2017-02-27 DIAGNOSIS — R634 Abnormal weight loss: Secondary | ICD-10-CM | POA: Diagnosis not present

## 2017-02-27 DIAGNOSIS — K5900 Constipation, unspecified: Secondary | ICD-10-CM

## 2017-02-27 MED ORDER — SUPREP BOWEL PREP KIT 17.5-3.13-1.6 GM/177ML PO SOLN: ORAL | 0 refills | Status: DC

## 2017-02-27 MED ORDER — OMEPRAZOLE 40 MG PO CPDR: 40.0000 mg | DELAYED_RELEASE_CAPSULE | Freq: Every day | ORAL | 11 refills | Status: DC

## 2017-02-27 MED ORDER — POLYETHYLENE GLYCOL 3350 17 GM/SCOOP PO POWD: ORAL | 3 refills | Status: DC

## 2017-02-27 NOTE — Patient Instructions (Signed)
If you are age 44 or older, your body mass index should be between 23-30. Your Body mass index is 26.94 kg/m. If this is out of the aforementioned range listed, please consider follow up with your Primary Care Provider.  If you are age 25 or younger, your body mass index should be between 19-25. Your Body mass index is 26.94 kg/m. If this is out of the aformentioned range listed, please consider follow up with your Primary Care Provider.     You have been scheduled for an endoscopy and colonoscopy. Please follow the written instructions given to you at your visit today. Please pick up your prep supplies at the pharmacy within the next 1-3 days. If you use inhalers (even only as needed), please bring them with you on the day of your procedure. Your physician has requested that you go to www.startemmi.com and enter the access code given to you at your visit today. This web site gives a general overview about your procedure. However, you should still follow specific instructions given to you by our office regarding your preparation for the procedure.  We have sent the following medications to your pharmacy for you to pick up at your convenience: Omeprazole 40mg   Please purchase the following medications over the counter and take as directed: Miralax - use as directed daily for constipation  You have been given Anti relux measures handout today.   Thank you for choosing me and [facility].  Pricilla Riffle. Dagoberto Ligas., MD., Mar[facility] Regal
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
## 2017-04-19 ENCOUNTER — Encounter: Payer: Self-pay | Admitting: Gastroenterology

## 2017-04-30 ENCOUNTER — Encounter: Payer: Self-pay | Admitting: Interventional Cardiology

## 2017-05-01 ENCOUNTER — Ambulatory Visit (AMBULATORY_SURGERY_CENTER): Payer: BLUE CROSS/BLUE SHIELD | Admitting: Gastroenterology

## 2017-05-01 ENCOUNTER — Encounter: Payer: Self-pay | Admitting: Gastroenterology

## 2017-05-01 VITALS — BP 98/65 | HR 76 | Temp 97.1°F | Resp 11 | Ht 72.0 in | Wt 198.0 lb

## 2017-05-01 DIAGNOSIS — R109 Unspecified abdominal pain: Secondary | ICD-10-CM | POA: Diagnosis not present

## 2017-05-01 DIAGNOSIS — D122 Benign neoplasm of ascending colon: Secondary | ICD-10-CM | POA: Diagnosis not present

## 2017-05-01 DIAGNOSIS — R1013 Epigastric pain: Secondary | ICD-10-CM

## 2017-05-01 DIAGNOSIS — R634 Abnormal weight loss: Secondary | ICD-10-CM

## 2017-05-01 DIAGNOSIS — K5900 Constipation, unspecified: Secondary | ICD-10-CM | POA: Diagnosis not present

## 2017-05-01 MED ORDER — SODIUM CHLORIDE 0.9 % IV SOLN: 500.0000 mL | INTRAVENOUS | Status: DC

## 2017-05-01 NOTE — Patient Instructions (Signed)
YOU HAD AN ENDOSCOPIC PROCEDURE TODAY AT [facility]:   Refer to the procedure report that was given to you for any specific questions about what was found during the examination.  If the procedure report does not answer your questions, please call your gastroenterologist to clarify.  If you requested that your care partner not be given the details of your procedure findings, then the procedure report has been included in a sealed envelope for you to review at your convenience later.  YOU SHOULD EXPECT: Some feelings of bloating in the abdomen. Passage of more gas than usual.  Walking can help get rid of the air that was put into your GI tract during the procedure and reduce the bloating. If you had a lower endoscopy (such as a colonoscopy or flexible sigmoidoscopy) you may notice spotting of blood in your stool or on the toilet paper. If you underwent a bowel prep for your procedure, you may not have a normal bowel movement for a few days.  Please Note:  You might notice some irritation and congestion in your nose or some drainage.  This is from the oxygen used during your procedure.  There is no need for concern and it should clear up in a day or so.  SYMPTOMS TO REPORT IMMEDIATELY:   Following lower endoscopy (colonoscopy or flexible sigmoidoscopy):  Excessive amounts of blood in the stool  Significant tenderness or worsening of abdominal pains  Swelling of the abdomen that is new, acute  Fever of 100F or higher   Following upper endoscopy (EGD)  Vomiting of blood or coffee ground material  New chest pain or pain under the shoulder blades  Painful or persistently difficult swallowing  New shortness of breath  Fever of 100F or higher  Black, tarry-looking stools  For urgent or emergent issues, a gastroenterologist can be reached at any hour by calling [phone].   DIET: Follow a High Fiber Diet (see handout given to you by your recovery nurse).  We do recommend a  small meal at first, but then you may proceed to your regular diet.  Drink plenty of fluids but you should avoid alcoholic beverages for 24 hours.  MEDICATIONS: Continue present medications.  Please see handouts given to you by your recovery nurse.  ACTIVITY:  You should plan to take it easy for the rest of today and you should NOT DRIVE or use heavy machinery until tomorrow (because of the sedation medicines used during the test).    FOLLOW UP: Our staff will call the number listed on your records the next business day following your procedure to check on you and address any questions or concerns that you may have regarding the information given to you following your procedure. If we do not reach you, we will leave a message.  However, if you are feeling well and you are not experiencing any problems, there is no need to return our call.  We will assume that you have returned to your regular daily activities without incident.  If any biopsies were taken you will be contacted by phone or by letter within the next 1-3 weeks.  Please call us at [phone] if you have not heard about the biopsies in 3 weeks.   Thank you for allowing [place] to provide for your healthcare needs today.  SIGNATURES/CONFIDENTIALITY: You and/or your care partner have signed paperwork which will be entered into your electronic medical record.  These signatures attest to the fact that that  the information above on your After Visit Summary has been reviewed and is understood.  Full responsibility of the confidentiality of this discharge information lies with you and/or your care-partner.

## 2017-05-01 NOTE — Op Note (Signed)
[facility] Patient Name: [name] Procedure Date: 05/01/2017 9:50 AM MRN: [id number] Endoscopist: Ladene Artist , MD Age: 45 Referring MD:  Date of Birth: 04-18-1971 Gender: Male Account #: [id number] Procedure:                Colonoscopy Indications:              Change in bowel habits, Constipation, Weight loss Medicines:                Monitored Anesthesia Care Procedure:                Pre-Anesthesia Assessment:                           - Prior to the procedure, a History and Physical                            was performed, and patient medications and                            allergies were reviewed. The patient's tolerance of                            previous anesthesia was also reviewed. The risks                            and benefits of the procedure and the sedation                            options and risks were discussed with the patient.                            All questions were answered, and informed consent                            was obtained. Prior Anticoagulants: The patient has                            taken no previous anticoagulant or antiplatelet                            agents. ASA Grade Assessment: II - A patient with                            mild systemic disease. After reviewing the risks                            and benefits, the patient was deemed in                            satisfactory condition to undergo the procedure.                           After obtaining informed consent, the colonoscope  was passed under direct vision. Throughout the                            procedure, the patient's blood pressure, pulse, and                            oxygen saturations were monitored continuously. The                            Colonoscope was introduced through the anus and                            advanced to the the cecum, identified by                            appendiceal orifice  and ileocecal valve. The                            ileocecal valve, appendiceal orifice, and rectum                            were photographed. The quality of the bowel                            preparation was excellent. The colonoscopy was                            performed without difficulty. The patient tolerated                            the procedure well. Scope In: 9:56:21 AM Scope Out: 10:11:19 AM Scope Withdrawal Time: 0 hours 13 minutes 0 seconds  Total Procedure Duration: 0 hours 14 minutes 58 seconds  Findings:                 The perianal and digital rectal examinations were                            normal.                           A 5 mm polyp was found in the ascending colon. The                            polyp was sessile. The polyp was removed with a                            cold snare. Resection and retrieval were complete.                           Many small-mouthed diverticula were found in the                            left colon. There was no evidence of diverticular  bleeding.                           Internal hemorrhoids were found during                            retroflexion. The hemorrhoids were small and Grade                            I (internal hemorrhoids that do not prolapse).                           The exam was otherwise without abnormality on                            direct and retroflexion views. Complications:            No immediate complications. Estimated blood loss:                            None. Estimated Blood Loss:     Estimated blood loss: none. Impression:               - One 5 mm polyp in the ascending colon, removed                            with a cold snare. Resected and retrieved.                           - Mild diverticulosis in the left colon. There was                            no evidence of diverticular bleeding.                           - Internal hemorrhoids.                            - The examination was otherwise normal on direct                            and retroflexion views. Recommendation:           - Repeat colonoscopy in 5 years for surveillance if                            polyp is precancerous, otherwise 10 years for                            screening.                           - Patient has a contact number available for                            emergencies. The signs and symptoms of potential  delayed complications were discussed with the                            patient. Return to normal activities tomorrow.                            Written discharge instructions were provided to the                            patient.                           - High fiber diet.                           - Continue present medications.                           - Await pathology results. Ladene Artist, MD 05/01/2017 10:23:34 AM This report has been signed electronically.

## 2017-05-01 NOTE — Progress Notes (Signed)
Report given to PACU, vss 

## 2017-05-01 NOTE — Op Note (Signed)
[facility] Patient Name: [name] Procedure Date: 05/01/2017 9:50 AM MRN: [id number] Endoscopist: Ladene Artist , MD Age: 46 Referring MD:  Date of Birth: [birth year] Gender: Male Account #: [id number] Procedure:                Upper GI endoscopy Indications:              Epigastric abdominal pain, Weight loss Medicines:                Monitored Anesthesia Care Procedure:                Pre-Anesthesia Assessment:                           - Prior to the procedure, a History and Physical                            was performed, and patient medications and                            allergies were reviewed. The patient's tolerance of                            previous anesthesia was also reviewed. The risks                            and benefits of the procedure and the sedation                            options and risks were discussed with the patient.                            All questions were answered, and informed consent                            was obtained. Prior Anticoagulants: The patient has                            taken no previous anticoagulant or antiplatelet                            agents. ASA Grade Assessment: II - A patient with                            mild systemic disease. After reviewing the risks                            and benefits, the patient was deemed in                            satisfactory condition to undergo the procedure.                           After obtaining informed consent, the endoscope was  passed under direct vision. Throughout the                            procedure, the patient's blood pressure, pulse, and                            oxygen saturations were monitored continuously. The                            Endoscope was introduced through the mouth, and                            advanced to the second part of duodenum. The upper                            GI endoscopy was  accomplished without difficulty.                            The patient tolerated the procedure well. Scope In: Scope Out: Findings:                 The examined esophagus was normal.                           A medium-sized hiatal hernia was present.                           The exam of the stomach was otherwise normal.                           The duodenal bulb and second portion of the                            duodenum were normal. Complications:            No immediate complications. Estimated Blood Loss:     Estimated blood loss: none. Impression:               - Normal esophagus.                           - Medium-sized hiatal hernia.                           - Normal duodenal bulb and second portion of the                            duodenum.                           - No specimens collected. Recommendation:           - Patient has a contact number available for                            emergencies. The signs and symptoms of potential  delayed complications were discussed with the                            patient. Return to normal activities tomorrow.                            Written discharge instructions were provided to the                            patient.                           - Resume previous diet.                           - Continue present medications. Ladene Artist, MD 05/01/2017 10:27:03 AM This report has been signed electronically.

## 2017-05-01 NOTE — Progress Notes (Signed)
Called to room to assist during endoscopic procedure.  Patient ID and intended procedure confirmed with present staff. Received instructions for my participation in the procedure from the performing physician.  

## 2017-05-02 ENCOUNTER — Telehealth: Payer: Self-pay

## 2017-05-02 NOTE — Telephone Encounter (Signed)
  Follow up Call-  Call back number 05/01/2017  Post procedure Call Back phone  # [phone]  Permission to leave phone message Yes  Some recent data might be hidden     Patient questions:  Do you have a fever, pain , or abdominal swelling? No. Pain Score  0 *  Have you tolerated food without any problems? Yes.    Have you been able to return to your normal activities? Yes.    Do you have any questions about your discharge instructions: Diet   No. Medications  No. Follow up visit  No.  Do you have questions or concerns about your Care? No.  Actions: * If pain score is 4 or above: No action needed, pain <4.

## 2017-05-10 ENCOUNTER — Telehealth: Payer: Self-pay | Admitting: Gastroenterology

## 2017-05-10 NOTE — Telephone Encounter (Signed)
Calling for Path results. Please advise.

## 2017-05-12 ENCOUNTER — Encounter: Payer: Self-pay | Admitting: Gastroenterology

## 2017-05-12 NOTE — Telephone Encounter (Signed)
We patients to expect a letter with the pathology result about 2 weeks after your procedure. See path letter

## 2017-05-13 NOTE — Telephone Encounter (Signed)
The pt has been given the path results and the letter was mailed to the pt    [name] [place] [id number]   Dear [name],  The polyp removed from your colon was benign, but precancerous. This means that it had the potential to change into cancer over time.  I recommend you have a repeat colonoscopy in 5 years to determine if you have developed any new polyps and to screen for colorectal cancer.   If you develop any new rectal bleeding, abdominal pain or significant bowel habit changes, please contact us before then at Dept: [phone].  Please call us if you have persistent problems or have questions about your condition that have not been fully answered at this time.  Sincerely,  Ladene Artist, MD
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
## 2017-05-16 ENCOUNTER — Ambulatory Visit (INDEPENDENT_AMBULATORY_CARE_PROVIDER_SITE_OTHER): Payer: BLUE CROSS/BLUE SHIELD | Admitting: Interventional Cardiology

## 2017-05-16 ENCOUNTER — Encounter: Payer: Self-pay | Admitting: Interventional Cardiology

## 2017-05-16 VITALS — BP 120/92 | HR 77 | Ht 72.0 in | Wt 203.2 lb

## 2017-05-16 DIAGNOSIS — I359 Nonrheumatic aortic valve disorder, unspecified: Secondary | ICD-10-CM | POA: Diagnosis not present

## 2017-05-16 DIAGNOSIS — E782 Mixed hyperlipidemia: Secondary | ICD-10-CM

## 2017-05-16 DIAGNOSIS — I10 Essential (primary) hypertension: Secondary | ICD-10-CM

## 2017-05-16 DIAGNOSIS — F17200 Nicotine dependence, unspecified, uncomplicated: Secondary | ICD-10-CM

## 2017-05-16 NOTE — Patient Instructions (Signed)

## 2017-06-04 ENCOUNTER — Other Ambulatory Visit: Payer: Self-pay | Admitting: Interventional Cardiology

## 2018-01-21 ENCOUNTER — Other Ambulatory Visit: Payer: Self-pay

## 2018-01-21 MED ORDER — OMEPRAZOLE 40 MG PO CPDR: 40.0000 mg | DELAYED_RELEASE_CAPSULE | Freq: Every day | ORAL | 0 refills | Status: DC

## 2018-04-11 ENCOUNTER — Other Ambulatory Visit: Payer: Self-pay | Admitting: Interventional Cardiology

## 2018-04-13 ENCOUNTER — Other Ambulatory Visit: Payer: Self-pay | Admitting: Interventional Cardiology

## 2018-05-02 ENCOUNTER — Other Ambulatory Visit: Payer: Self-pay | Admitting: Gastroenterology

## 2018-05-15 ENCOUNTER — Other Ambulatory Visit: Payer: Self-pay | Admitting: Interventional Cardiology

## 2018-05-22 ENCOUNTER — Other Ambulatory Visit: Payer: Self-pay | Admitting: Gastroenterology

## 2018-06-19 ENCOUNTER — Other Ambulatory Visit: Payer: Self-pay | Admitting: Interventional Cardiology

## 2018-06-27 DIAGNOSIS — Z6827 Body mass index (BMI) 27.0-27.9, adult: Secondary | ICD-10-CM | POA: Diagnosis not present

## 2018-06-27 DIAGNOSIS — N410 Acute prostatitis: Secondary | ICD-10-CM | POA: Diagnosis not present

## 2018-06-27 DIAGNOSIS — R109 Unspecified abdominal pain: Secondary | ICD-10-CM | POA: Diagnosis not present

## 2018-06-27 DIAGNOSIS — R350 Frequency of micturition: Secondary | ICD-10-CM | POA: Diagnosis not present

## 2018-07-13 ENCOUNTER — Other Ambulatory Visit: Payer: Self-pay | Admitting: Interventional Cardiology

## 2018-07-21 ENCOUNTER — Other Ambulatory Visit: Payer: Self-pay | Admitting: Interventional Cardiology

## 2018-07-21 MED ORDER — PRAVASTATIN SODIUM 20 MG PO TABS: 20.0000 mg | ORAL_TABLET | Freq: Every day | ORAL | 0 refills | Status: DC

## 2018-07-22 ENCOUNTER — Other Ambulatory Visit: Payer: Self-pay | Admitting: Interventional Cardiology

## 2018-08-15 ENCOUNTER — Other Ambulatory Visit: Payer: Self-pay | Admitting: Interventional Cardiology

## 2018-08-23 ENCOUNTER — Other Ambulatory Visit: Payer: Self-pay | Admitting: Interventional Cardiology

## 2018-09-02 DIAGNOSIS — R5382 Chronic fatigue, unspecified: Secondary | ICD-10-CM | POA: Diagnosis not present

## 2018-09-02 DIAGNOSIS — E611 Iron deficiency: Secondary | ICD-10-CM | POA: Diagnosis not present

## 2018-09-02 DIAGNOSIS — M791 Myalgia, unspecified site: Secondary | ICD-10-CM | POA: Diagnosis not present

## 2018-09-02 DIAGNOSIS — E782 Mixed hyperlipidemia: Secondary | ICD-10-CM | POA: Diagnosis not present

## 2018-09-02 DIAGNOSIS — Z125 Encounter for screening for malignant neoplasm of prostate: Secondary | ICD-10-CM | POA: Diagnosis not present

## 2018-09-02 DIAGNOSIS — R739 Hyperglycemia, unspecified: Secondary | ICD-10-CM | POA: Diagnosis not present

## 2018-09-02 DIAGNOSIS — E538 Deficiency of other specified B group vitamins: Secondary | ICD-10-CM | POA: Diagnosis not present

## 2018-09-18 ENCOUNTER — Other Ambulatory Visit: Payer: Self-pay | Admitting: *Deleted

## 2018-09-18 MED ORDER — LISINOPRIL-HYDROCHLOROTHIAZIDE 10-12.5 MG PO TABS: 1.0000 | ORAL_TABLET | Freq: Every day | ORAL | 0 refills | Status: DC

## 2018-09-23 ENCOUNTER — Other Ambulatory Visit: Payer: Self-pay | Admitting: Interventional Cardiology

## 2018-10-09 ENCOUNTER — Telehealth: Payer: Self-pay

## 2018-10-09 NOTE — Telephone Encounter (Signed)
Virtual Visit Pre-Appointment Phone Call    TELEPHONE CALL NOTE  [name] has been deemed a candidate for a follow-up tele-health visit to limit community exposure during the Covid-19 pandemic. I spoke with the patient via phone to ensure availability of phone/video source, confirm preferred email & phone number, and discuss instructions and expectations.  I reminded [name] to be prepared with any vital sign and/or heart rhythm information that could potentially be obtained via home monitoring, at the time of his visit. I reminded [name] to expect a phone call at the time of his visit if his visit.  Did the patient verbally acknowledge consent to treatment? YES  Scheduled VIDEO Visit with the patient on 4/6 Patient is going to activate his [place], RN 10/09/2018 11:30 AM   DOWNLOADING [place], go to [employer] and type in WebEx in the search bar. [place] [employer], the blue/green circle. The app is free but as with any other app downloads, their phone may require them to verify saved payment information or Apple password. The patient does NOT have to create an account.  - If Android, ask patient to go to [employer] and type in WebEx in the search bar. [place] [employer], the blue/green circle. The app is free but as with any other app downloads, their phone may require them to verify saved payment information or Android password. The patient does NOT have to create an account.   CONSENT FOR TELE-HEALTH VISIT - PLEASE REVIEW  I hereby voluntarily request, consent and authorize [facility] and its employed or contracted physicians, physician assistants, nurse practitioners or other licensed health care professionals (the Practitioner), to provide me with telemedicine health care services (the "Services") as deemed necessary by the treating Practitioner. I acknowledge and consent  to receive the Services by the Practitioner via telemedicine. I understand that the telemedicine visit will involve communicating with the Practitioner through live audiovisual communication technology and the disclosure of certain medical information by electronic transmission. I acknowledge that I have been given the opportunity to request an in-person assessment or other available alternative prior to the telemedicine visit and am voluntarily participating in the telemedicine visit.  I understand that I have the right to withhold or withdraw my consent to the use of telemedicine in the course of my care at any time, without affecting my right to future care or treatment, and that the Practitioner or I may terminate the telemedicine visit at any time. I understand that I have the right to inspect all information obtained and/or recorded in the course of the telemedicine visit and may receive copies of available information for a reasonable fee.  I understand that some of the potential risks of receiving the Services via telemedicine include:  Marland Kitchen Delay or interruption in medical evaluation due to technological equipment failure or disruption; . Information transmitted may not be sufficient (e.g. poor resolution of images) to allow for appropriate medical decision making by the Practitioner; and/or  . In rare instances, security protocols could fail, causing a breach of personal health information.  Furthermore, I acknowledge that it is my responsibility to provide information about my medical history, conditions and care that is complete and accurate to the best of my ability. I acknowledge that Practitioner's advice, recommendations, and/or decision may be based on factors not within their control, such as incomplete or inaccurate data provided  by me or distortions of diagnostic images or specimens that may result from electronic transmissions. I understand that the practice of medicine is not an exact  science and that Practitioner makes no warranties or guarantees regarding treatment outcomes. I acknowledge that I will receive a copy of this consent concurrently upon execution via email to the email address I last provided but may also request a printed copy by calling the office of [place].    I understand that my insurance will be billed for this visit.   I have read or had this consent read to me. . I understand the contents of this consent, which adequately explains the benefits and risks of the Services being provided via telemedicine.  . I have been provided ample opportunity to ask questions regarding this consent and the Services and have had my questions answered to my satisfaction. . I give my informed consent for the services to be provided through the use of telemedicine in my medical care  By participating in this telemedicine visit I agree to the above.

## 2018-10-10 ENCOUNTER — Ambulatory Visit: Payer: BLUE CROSS/BLUE SHIELD | Admitting: Interventional Cardiology
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
## 2018-10-13 ENCOUNTER — Other Ambulatory Visit: Payer: Self-pay

## 2018-10-13 ENCOUNTER — Encounter: Payer: Self-pay | Admitting: Interventional Cardiology

## 2018-10-13 ENCOUNTER — Telehealth (INDEPENDENT_AMBULATORY_CARE_PROVIDER_SITE_OTHER): Payer: BLUE CROSS/BLUE SHIELD | Admitting: Interventional Cardiology

## 2018-10-13 VITALS — BP 138/89 | HR 86 | Ht 72.0 in | Wt 202.0 lb

## 2018-10-13 DIAGNOSIS — I10 Essential (primary) hypertension: Secondary | ICD-10-CM | POA: Diagnosis not present

## 2018-10-13 DIAGNOSIS — E782 Mixed hyperlipidemia: Secondary | ICD-10-CM | POA: Diagnosis not present

## 2018-10-13 DIAGNOSIS — I359 Nonrheumatic aortic valve disorder, unspecified: Secondary | ICD-10-CM | POA: Diagnosis not present

## 2018-10-13 DIAGNOSIS — F17200 Nicotine dependence, unspecified, uncomplicated: Secondary | ICD-10-CM | POA: Diagnosis not present

## 2018-10-13 MED ORDER — LISINOPRIL-HYDROCHLOROTHIAZIDE 10-12.5 MG PO TABS: 1.0000 | ORAL_TABLET | Freq: Every day | ORAL | 3 refills | Status: DC

## 2018-10-13 MED ORDER — PRAVASTATIN SODIUM 20 MG PO TABS: 20.0000 mg | ORAL_TABLET | Freq: Every day | ORAL | 3 refills | Status: DC

## 2018-10-13 NOTE — Telephone Encounter (Signed)
Called and reviewed set up with patient.

## 2018-10-13 NOTE — Patient Instructions (Signed)
Medication Instructions:  Your physician recommends that you continue on your current medications as directed. Please refer to the Current Medication list given to you today.  If you need a refill on your cardiac medications before your next appointment, please call your pharmacy.   Lab work: None Ordered  If you have labs (blood work) drawn today and your tests are completely normal, you will receive your results only by: Marland Kitchen MyChart Message (if you have MyChart) OR . A paper copy in the mail If you have any lab test that is abnormal or we need to change your treatment, we will call you to review the results.  Testing/Procedures: None ordered  Follow-Up: At [facility], you and your health needs are our priority.  As part of our continuing mission to provide you with exceptional heart care, we have created designated Provider Care Teams.  These Care Teams include your primary Cardiologist (physician) and Advanced Practice Providers (APPs -  Physician Assistants and Nurse Practitioners) who all work together to provide you with the care you need, when you need it. . You will need a follow up appointment in 1 year.  Please call our office 2 months in advance to schedule this appointment.  You may see Casandra Doffing, MD or one of the following Advanced Practice Providers on your designated Care Team:   . Lyda Jester, PA-C . Dayna Dunn, PA-C . Ermalinda Barrios, PA-C  Any Other Special Instructions Will Be Listed Below (If Applicable).  Monitor your Blood Pressure and let us know if it becomes elevated

## 2018-10-13 NOTE — Telephone Encounter (Signed)
New Message ° ° °Needs help setting up device for virtual visit. °

## 2018-12-14 ENCOUNTER — Other Ambulatory Visit: Payer: Self-pay | Admitting: Gastroenterology

## 2019-04-22 ENCOUNTER — Other Ambulatory Visit: Payer: Self-pay

## 2019-04-22 DIAGNOSIS — Z0289 Encounter for other administrative examinations: Secondary | ICD-10-CM

## 2019-04-23 LAB — POCT URINALYSIS DIPSTICK
Bilirubin, UA: NEGATIVE
Blood, UA: NEGATIVE
Glucose, UA: NEGATIVE
Ketones, UA: NEGATIVE
Leukocytes, UA: NEGATIVE
Nitrite, UA: NEGATIVE
Protein, UA: NEGATIVE
Spec Grav, UA: 1.02 (ref 1.010–1.025)
Urobilinogen, UA: 0.2 E.U./dL
pH, UA: 6 (ref 5.0–8.0)

## 2019-04-30 ENCOUNTER — Other Ambulatory Visit: Payer: Self-pay | Admitting: Interventional Cardiology

## 2019-05-04 ENCOUNTER — Other Ambulatory Visit: Payer: Self-pay

## 2019-05-04 ENCOUNTER — Ambulatory Visit: Payer: 59 | Admitting: Occupational Medicine

## 2019-05-04 ENCOUNTER — Encounter: Payer: Self-pay | Admitting: Occupational Medicine

## 2019-05-04 DIAGNOSIS — Z0000 Encounter for general adult medical examination without abnormal findings: Secondary | ICD-10-CM

## 2019-05-04 NOTE — Progress Notes (Signed)
Moorebrand Ibuprofen 200 mg /tab (2 tabs/pack) 12 packs given Lot # [id number] Exp: 08/08/2020  Medigue Loradamed 10 mg/tablet (1 tab/pack) 10 packs given Lot # [id number] Exp:  09/06/2019  Major Cough & Cold 16 tablets/box 1 box given Lot # [id number] Exp:  12/07/2019

## 2019-05-22 ENCOUNTER — Telehealth: Payer: Self-pay

## 2019-05-22 NOTE — Telephone Encounter (Signed)
[name] called today to check on his PSA results.  The order was in [place], but no results.  Called [facility] hospital services at [phone] & spoke with [name].  He found where the results were sent to [facility] to  Dr. Geoffry Paradise.  He faxed the results to [place].  Called [name] back & reviewed lab results.   PSA 1.9 (0.0 - 4.0) PSA, Free 0.51 % Free PSA 39.2  [name] also said he received a bill from [place] for the test. Advised him to bring it to the clinic & give it to  [name], BSN, RN, [employer], [facility]  AMD

## 2019-10-08 ENCOUNTER — Encounter: Payer: Self-pay | Admitting: Physician Assistant

## 2019-10-08 ENCOUNTER — Other Ambulatory Visit: Payer: Self-pay

## 2019-10-08 ENCOUNTER — Ambulatory Visit: Payer: 59 | Admitting: Physician Assistant

## 2019-10-08 VITALS — BP 140/92 | HR 77 | Temp 98.3°F | Resp 16 | Ht 72.0 in | Wt 210.0 lb

## 2019-10-08 DIAGNOSIS — S39012A Strain of muscle, fascia and tendon of lower back, initial encounter: Secondary | ICD-10-CM

## 2019-10-08 MED ORDER — CYCLOBENZAPRINE HCL 10 MG PO TABS: 10.0000 mg | ORAL_TABLET | Freq: Three times a day (TID) | ORAL | 0 refills | Status: DC | PRN

## 2019-10-08 NOTE — Progress Notes (Signed)
   Subjective: Low back pain    Patient ID: [name], male    DOB: 04/06/1971, 49 y.o.   MRN: [id number]  HPI Patient complain of low back pain secondary to lifting incident 5 days ago.  Patient denies radicular component to her back pain.  Patient denies bladder or bowel dysfunction.  Patient rates his pain as a 5/10.  Patient described pain as "achy". State mild transient relief with ibuprofen.   Review of Systems Hyperlipidemia, hypertension, and GERD.    Objective:   Physical Exam Patient appears no acute distress.  Patient sits and stands with follow-up assistance from upper extremities.  No obvious lumbar spine deformity.  No guarding with palpation of the lumbar spine.  No CVA guarding.  Patient decreased range of motion of left lateral movements.  Bilateral paraspinal muscle spasms with flexion.  Patient had negative straight leg test in supine position.     Assessment & Plan: Lumbar strain.  Patient given discharge care instructions.  Patient had a prescription of Flexeril and ibuprofen.  Advised to follow-up in 5 days if no improvement or worsening complaint.

## 2019-10-08 NOTE — Progress Notes (Signed)
Hasn't taken BP met this AM.  Low back pain all the way across - 1st started out on left side. Moving a dresser on Saturday - felt something pull. Worked Sunday.  Seemed better Monday & Tuesday.  Yesterday started hurting more. Has been alternating tylenol 500 mg & ibuprofenn 400mg.  Last tylenol this morning around 3:00 am. No trouble sleeping.  No ice or heat.  AMD 

## 2019-11-03 ENCOUNTER — Other Ambulatory Visit: Payer: Self-pay | Admitting: Interventional Cardiology

## 2019-11-22 ENCOUNTER — Other Ambulatory Visit: Payer: Self-pay | Admitting: Interventional Cardiology

## 2020-01-26 ENCOUNTER — Other Ambulatory Visit: Payer: Self-pay | Admitting: Interventional Cardiology

## 2020-02-16 ENCOUNTER — Other Ambulatory Visit: Payer: Self-pay | Admitting: Interventional Cardiology

## 2020-02-23 ENCOUNTER — Other Ambulatory Visit: Payer: Self-pay

## 2020-02-23 ENCOUNTER — Ambulatory Visit: Payer: Self-pay

## 2020-02-23 DIAGNOSIS — Z0000 Encounter for general adult medical examination without abnormal findings: Secondary | ICD-10-CM

## 2020-02-23 LAB — POCT URINALYSIS DIPSTICK
Bilirubin, UA: NEGATIVE
Blood, UA: NEGATIVE
Glucose, UA: NEGATIVE
Ketones, UA: NEGATIVE
Leukocytes, UA: NEGATIVE
Nitrite, UA: NEGATIVE
Protein, UA: NEGATIVE
Spec Grav, UA: 1.015 (ref 1.010–1.025)
Urobilinogen, UA: 0.2 E.U./dL
pH, UA: 6 (ref 5.0–8.0)

## 2020-02-24 LAB — CMP12+LP+TP+TSH+6AC+PSA+CBC…
ALT: 26 IU/L (ref 0–44)
AST: 21 IU/L (ref 0–40)
Albumin/Globulin Ratio: 1.8 (ref 1.2–2.2)
Albumin: 4.4 g/dL (ref 4.0–5.0)
Alkaline Phosphatase: 73 IU/L (ref 48–121)
BUN/Creatinine Ratio: 14 (ref 9–20)
BUN: 13 mg/dL (ref 6–24)
Basophils Absolute: 0.1 10*3/uL (ref 0.0–0.2)
Basos: 1 %
Bilirubin Total: 0.5 mg/dL (ref 0.0–1.2)
Calcium: 9.3 mg/dL (ref 8.7–10.2)
Chloride: 101 mmol/L (ref 96–106)
Chol/HDL Ratio: 2.6 ratio (ref 0.0–5.0)
Cholesterol, Total: 159 mg/dL (ref 100–199)
Creatinine, Ser: 0.93 mg/dL (ref 0.76–1.27)
EOS (ABSOLUTE): 0.3 10*3/uL (ref 0.0–0.4)
Eos: 5 %
Estimated CHD Risk: <0.5 times avg. (ref 0.0–1.0)
Free Thyroxine Index: 1.8 (ref 1.2–4.9)
GFR calc Af Amer: 113 mL/min/{1.73_m2} (ref >59)
GFR calc non Af Amer: 97 mL/min/{1.73_m2} (ref >59)
GGT: 17 IU/L (ref 0–65)
Globulin, Total: 2.5 g/dL (ref 1.5–4.5)
Glucose: 123 mg/dL — ABNORMAL HIGH (ref 65–99)
HDL: 61 mg/dL (ref >39)
Hematocrit: 41.1 % (ref 37.5–51.0)
Hemoglobin: 14.1 g/dL (ref 13.0–17.7)
Immature Grans (Abs): 0 10*3/uL (ref 0.0–0.1)
Immature Granulocytes: 0 %
Iron: 147 ug/dL (ref 38–169)
LDH: 163 IU/L (ref 121–224)
LDL Chol Calc (NIH): 87 mg/dL (ref 0–99)
Lymphocytes Absolute: 2.1 10*3/uL (ref 0.7–3.1)
Lymphs: 31 %
MCH: 31.5 pg (ref 26.6–33.0)
MCHC: 34.3 g/dL (ref 31.5–35.7)
MCV: 92 fL (ref 79–97)
Monocytes Absolute: 0.5 10*3/uL (ref 0.1–0.9)
Monocytes: 7 %
Neutrophils Absolute: 3.8 10*3/uL (ref 1.4–7.0)
Neutrophils: 56 %
Phosphorus: 3.2 mg/dL (ref 2.8–4.1)
Platelets: 263 10*3/uL (ref 150–450)
Potassium: 4.2 mmol/L (ref 3.5–5.2)
Prostate Specific Ag, Serum: 1.1 ng/mL (ref 0.0–4.0)
RBC: 4.47 x10E6/uL (ref 4.14–5.80)
RDW: 11.4 % — ABNORMAL LOW (ref 11.6–15.4)
Sodium: 136 mmol/L (ref 134–144)
T3 Uptake Ratio: 28 % (ref 24–39)
T4, Total: 6.5 ug/dL (ref 4.5–12.0)
TSH: 0.571 u[IU]/mL (ref 0.450–4.500)
Total Protein: 6.9 g/dL (ref 6.0–8.5)
Triglycerides: 51 mg/dL (ref 0–149)
Uric Acid: 4.6 mg/dL (ref 3.8–8.4)
VLDL Cholesterol Cal: 11 mg/dL (ref 5–40)
WBC: 6.7 10*3/uL (ref 3.4–10.8)

## 2020-02-26 LAB — QUANTIFERON-TB GOLD PLUS
QuantiFERON Mitogen Value: >10 IU/mL
QuantiFERON Nil Value: 0 IU/mL
QuantiFERON TB1 Ag Value: 0.01 IU/mL
QuantiFERON TB2 Ag Value: 0 IU/mL
QuantiFERON-TB Gold Plus: NEGATIVE

## 2020-02-29 ENCOUNTER — Encounter: Payer: Self-pay | Admitting: Emergency Medicine

## 2020-02-29 ENCOUNTER — Other Ambulatory Visit: Payer: Self-pay

## 2020-02-29 ENCOUNTER — Ambulatory Visit: Payer: Self-pay | Admitting: Emergency Medicine

## 2020-02-29 VITALS — BP 119/95 | HR 84 | Temp 98.7°F | Resp 12 | Ht 72.0 in | Wt 200.0 lb

## 2020-02-29 DIAGNOSIS — Z0000 Encounter for general adult medical examination without abnormal findings: Secondary | ICD-10-CM

## 2020-02-29 MED ORDER — PRAVASTATIN SODIUM 20 MG PO TABS
ORAL_TABLET | ORAL | 0 refills | Status: DC
Start: 2020-02-29 — End: 2020-03-24

## 2020-02-29 MED ORDER — FLUTICASONE PROPIONATE 50 MCG/ACT NA SUSP
2.0000 | Freq: Every day | NASAL | 6 refills | Status: DC
Start: 2020-02-29 — End: 2022-01-10
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
## 2020-03-03 ENCOUNTER — Ambulatory Visit: Payer: BLUE CROSS/BLUE SHIELD | Admitting: Interventional Cardiology

## 2020-03-04 ENCOUNTER — Ambulatory Visit: Payer: BLUE CROSS/BLUE SHIELD | Admitting: Interventional Cardiology

## 2020-03-24 ENCOUNTER — Other Ambulatory Visit: Payer: Self-pay | Admitting: Interventional Cardiology

## 2020-04-07 ENCOUNTER — Other Ambulatory Visit: Payer: Self-pay | Admitting: Interventional Cardiology
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
## 2020-04-27 ENCOUNTER — Other Ambulatory Visit: Payer: Self-pay

## 2020-04-27 ENCOUNTER — Ambulatory Visit (INDEPENDENT_AMBULATORY_CARE_PROVIDER_SITE_OTHER): Payer: 59 | Admitting: Interventional Cardiology

## 2020-04-27 ENCOUNTER — Encounter: Payer: Self-pay | Admitting: Interventional Cardiology

## 2020-04-27 VITALS — BP 120/68 | HR 84 | Ht 72.0 in | Wt 197.6 lb

## 2020-04-27 DIAGNOSIS — E782 Mixed hyperlipidemia: Secondary | ICD-10-CM | POA: Diagnosis not present

## 2020-04-27 DIAGNOSIS — F17200 Nicotine dependence, unspecified, uncomplicated: Secondary | ICD-10-CM

## 2020-04-27 DIAGNOSIS — R69 Illness, unspecified: Secondary | ICD-10-CM | POA: Diagnosis not present

## 2020-04-27 DIAGNOSIS — I10 Essential (primary) hypertension: Secondary | ICD-10-CM

## 2020-04-27 DIAGNOSIS — I359 Nonrheumatic aortic valve disorder, unspecified: Secondary | ICD-10-CM

## 2020-04-27 MED ORDER — LISINOPRIL-HYDROCHLOROTHIAZIDE 10-12.5 MG PO TABS: 1.0000 | ORAL_TABLET | Freq: Every day | ORAL | 3 refills | Status: DC

## 2020-04-27 MED ORDER — PRAVASTATIN SODIUM 20 MG PO TABS: ORAL_TABLET | ORAL | 3 refills | Status: DC

## 2020-04-27 NOTE — Patient Instructions (Signed)
Medication Instructions:  Your physician recommends that you continue on your current medications as directed. Please refer to the Current Medication list given to you today.  *If you need a refill on your cardiac medications before your next appointment, please call your pharmacy*   Lab Work: none If you have labs (blood work) drawn today and your tests are completely normal, you will receive your results only by: Marland Kitchen MyChart Message (if you have MyChart) OR . A paper copy in the mail If you have any lab test that is abnormal or we need to change your treatment, we will call you to review the results.   Testing/Procedures: none   Follow-Up: At [facility], you and your health needs are our priority.  As part of our continuing mission to provide you with exceptional heart care, we have created designated Provider Care Teams.  These Care Teams include your primary Cardiologist (physician) and Advanced Practice Providers (APPs -  Physician Assistants and Nurse Practitioners) who all work together to provide you with the care you need, when you need it.  We recommend signing up for the patient portal called "MyChart".  Sign up information is provided on this After Visit Summary.  MyChart is used to connect with patients for Virtual Visits (Telemedicine).  Patients are able to view lab/test results, encounter notes, upcoming appointments, etc.  Non-urgent messages can be sent to your provider as well.   To learn more about what you can do with MyChart, go to [email or web address].    Your next appointment:   1} year(s)  The format for your next appointment:   In Person  Provider:   You may see Larae Grooms, MD or one of the following Advanced Practice Providers on your designated Care Team:    Melina Copa, PA-C  Ermalinda Barrios, PA-C    Other Instructions  High-Fiber Diet Fiber, also called dietary fiber, is a type of carbohydrate that is found in fruits, vegetables, whole  grains, and beans. A high-fiber diet can have many health benefits. Your health care provider may recommend a high-fiber diet to help:  Prevent constipation. Fiber can make your bowel movements more regular.  Lower your cholesterol.  Relieve the following conditions: ? Swelling of veins in the anus (hemorrhoids). ? Swelling and irritation (inflammation) of specific areas of the digestive tract (uncomplicated diverticulosis). ? A problem of the large intestine (colon) that sometimes causes pain and diarrhea (irritable bowel syndrome, IBS).  Prevent overeating as part of a weight-loss plan.  Prevent heart disease, type 2 diabetes, and certain cancers. What is my plan? The recommended daily fiber intake in grams (g) includes:  38 g for men age 73 or younger.  30 g for men over age 72.  29 g for women age 57 or younger.  21 g for women over age 32. You can get the recommended daily intake of dietary fiber by:  Eating a variety of fruits, vegetables, grains, and beans.  Taking a fiber supplement, if it is not possible to get enough fiber through your diet. What do I need to know about a high-fiber diet?  It is better to get fiber through food sources rather than from fiber supplements. There is not a lot of research about how effective supplements are.  Always check the fiber content on the nutrition facts label of any prepackaged food. Look for foods that contain 5 g of fiber or more per serving.  Talk with a diet and nutrition specialist (dietitian) if you  have questions about specific foods that are recommended or not recommended for your medical condition, especially if those foods are not listed below.  Gradually increase how much fiber you consume. If you increase your intake of dietary fiber too quickly, you may have bloating, cramping, or gas.  Drink plenty of water. Water helps you to digest fiber. What are tips for following this plan?  Eat a wide variety of high-fiber  foods.  Make sure that half of the grains that you eat each day are whole grains.  Eat breads and cereals that are made with whole-grain flour instead of refined flour or white flour.  Eat brown rice, bulgur wheat, or millet instead of white rice.  Start the day with a breakfast that is high in fiber, such as a cereal that contains 5 g of fiber or more per serving.  Use beans in place of meat in soups, salads, and pasta dishes.  Eat high-fiber snacks, such as berries, raw vegetables, nuts, and popcorn.  Choose whole fruits and vegetables instead of processed forms like juice or sauce. What foods can I eat?  Fruits Berries. Pears. Apples. Oranges. Avocado. Prunes and raisins. Dried figs. Vegetables Sweet potatoes. Spinach. Kale. Artichokes. Cabbage. Broccoli. Cauliflower. Green peas. Carrots. Squash. Grains Whole-grain breads. Multigrain cereal. Oats and oatmeal. Brown rice. Barley. Bulgur wheat. Millet. Quinoa. Bran muffins. Popcorn. Rye wafer crackers. Meats and other proteins Navy, kidney, and pinto beans. Soybeans. Split peas. Lentils. Nuts and seeds. Dairy Fiber-fortified yogurt. Beverages Fiber-fortified soy milk. Fiber-fortified orange juice. Other foods Fiber bars. The items listed above may not be a complete list of recommended foods and beverages. Contact a dietitian for more options. What foods are not recommended? Fruits Fruit juice. Cooked, strained fruit. Vegetables Fried potatoes. Canned vegetables. Well-cooked vegetables. Grains White bread. Pasta made with refined flour. White rice. Meats and other proteins Fatty cuts of meat. Fried chicken or fried fish. Dairy Milk. Yogurt. Cream cheese. Sour cream. Fats and oils Butters. Beverages Soft drinks. Other foods Cakes and pastries. The items listed above may not be a complete list of foods and beverages to avoid. Contact a dietitian for more information. Summary  Fiber is a type of carbohydrate. It is  found in fruits, vegetables, whole grains, and beans.  There are many health benefits of eating a high-fiber diet, such as preventing constipation, lowering blood cholesterol, helping with weight loss, and reducing your risk of heart disease, diabetes, and certain cancers.  Gradually increase your intake of fiber. Increasing too fast can result in cramping, bloating, and gas. Drink plenty of water while you increase your fiber.  The best sources of fiber include whole fruits and vegetables, whole grains, nuts, seeds, and beans. This information is not intended to replace advice given to you by your health care provider. Make sure you discuss any questions you have with your health care provider. Document Revised: 04/29/2017 Document Reviewed: 04/29/2017 Elsevier Patient Education  2020 Elsevier Inc.   

## 2020-06-17 ENCOUNTER — Other Ambulatory Visit: Payer: Self-pay | Admitting: Gastroenterology

## 2020-07-27 ENCOUNTER — Telehealth: Payer: Self-pay | Admitting: Family Medicine

## 2020-07-27 NOTE — Telephone Encounter (Signed)
Pt. 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 States his BP is under good control and already see's a cardiologist and is not interested in quitting smoking at this time. Pt informed that he can call back to schedule an appointment if he changes his mind.
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
## 2020-09-20 ENCOUNTER — Other Ambulatory Visit: Payer: Self-pay

## 2020-09-20 ENCOUNTER — Ambulatory Visit (INDEPENDENT_AMBULATORY_CARE_PROVIDER_SITE_OTHER): Payer: 59 | Admitting: Interventional Cardiology

## 2020-09-20 ENCOUNTER — Encounter: Payer: Self-pay | Admitting: Interventional Cardiology

## 2020-09-20 VITALS — BP 110/86 | HR 82 | Ht 72.0 in | Wt 200.6 lb

## 2020-09-20 DIAGNOSIS — I359 Nonrheumatic aortic valve disorder, unspecified: Secondary | ICD-10-CM | POA: Diagnosis not present

## 2020-09-20 DIAGNOSIS — F17200 Nicotine dependence, unspecified, uncomplicated: Secondary | ICD-10-CM | POA: Diagnosis not present

## 2020-09-20 DIAGNOSIS — I10 Essential (primary) hypertension: Secondary | ICD-10-CM

## 2020-09-20 DIAGNOSIS — E782 Mixed hyperlipidemia: Secondary | ICD-10-CM | POA: Diagnosis not present

## 2020-09-20 DIAGNOSIS — R69 Illness, unspecified: Secondary | ICD-10-CM | POA: Diagnosis not present

## 2020-09-20 NOTE — Patient Instructions (Signed)
Medication Instructions:  Your physician recommends that you continue on your current medications as directed. Please refer to the Current Medication list given to you today.  *If you need a refill on your cardiac medications before your next appointment, please call your pharmacy*   Lab Work: none If you have labs (blood work) drawn today and your tests are completely normal, you will receive your results only by: . MyChart Message (if you have MyChart) OR . A paper copy in the mail If you have any lab test that is abnormal or we need to change your treatment, we will call you to review the results.   Testing/Procedures: Your physician has requested that you have an echocardiogram. Echocardiography is a painless test that uses sound waves to create images of your heart. It provides your doctor with information about the size and shape of your heart and how well your heart's chambers and valves are working. This procedure takes approximately one hour. There are no restrictions for this procedure.     Follow-Up: At [facility], you and your health needs are our priority.  As part of our continuing mission to provide you with exceptional heart care, we have created designated Provider Care Teams.  These Care Teams include your primary Cardiologist (physician) and Advanced Practice Providers (APPs -  Physician Assistants and Nurse Practitioners) who all work together to provide you with the care you need, when you need it.  We recommend signing up for the patient portal called "MyChart".  Sign up information is provided on this After Visit Summary.  MyChart is used to connect with patients for Virtual Visits (Telemedicine).  Patients are able to view lab/test results, encounter notes, upcoming appointments, etc.  Non-urgent messages can be sent to your provider as well.   To learn more about what you can do with MyChart, go to [email or web address].    Your next appointment:   12  month(s)  The format for your next appointment:   In Person  Provider:   You may see Jayadeep Varanasi, MD or one of the following Advanced Practice Providers on your designated Care Team:    Dayna Dunn, PA-C  Michele Lenze, PA-C    Other Instructions   

## 2020-10-20 ENCOUNTER — Other Ambulatory Visit: Payer: Self-pay | Admitting: *Deleted

## 2020-10-20 ENCOUNTER — Other Ambulatory Visit: Payer: Self-pay

## 2020-10-20 ENCOUNTER — Ambulatory Visit (HOSPITAL_COMMUNITY): Payer: 59 | Attending: Cardiology

## 2020-10-20 DIAGNOSIS — I77810 Thoracic aortic ectasia: Secondary | ICD-10-CM

## 2020-10-20 DIAGNOSIS — I359 Nonrheumatic aortic valve disorder, unspecified: Secondary | ICD-10-CM | POA: Diagnosis not present

## 2020-10-20 LAB — ECHOCARDIOGRAM COMPLETE
Area-P 1/2: 3.85 cm2
S' Lateral: 3.4 cm

## 2020-11-02 NOTE — Addendum Note (Signed)
Addended by: Thompson Grayer on: 11/02/2020 02:23 PM   Modules accepted: Orders

## 2020-11-02 NOTE — Progress Notes (Signed)
Order updated for CTA to be done in October 2022

## 2021-01-26 ENCOUNTER — Other Ambulatory Visit: Payer: Self-pay | Admitting: Interventional Cardiology

## 2021-03-06 NOTE — Progress Notes (Signed)
Pt annual schedule 03/16/21.  Reviewed CDC recommendations for importance of HIV/ Hep C screening once in lifetime. Patient has declined HIV / Hep C screenings today and will let us know if they should change their mind in the future.

## 2021-03-07 ENCOUNTER — Ambulatory Visit: Payer: Self-pay

## 2021-03-07 ENCOUNTER — Other Ambulatory Visit: Payer: Self-pay

## 2021-03-07 DIAGNOSIS — Z0000 Encounter for general adult medical examination without abnormal findings: Secondary | ICD-10-CM

## 2021-03-07 LAB — POCT URINALYSIS DIPSTICK
Bilirubin, UA: NEGATIVE
Blood, UA: NEGATIVE
Glucose, UA: NEGATIVE
Ketones, UA: NEGATIVE
Leukocytes, UA: NEGATIVE
Nitrite, UA: NEGATIVE
Protein, UA: NEGATIVE
Spec Grav, UA: 1.02 (ref 1.010–1.025)
Urobilinogen, UA: 0.2 E.U./dL
pH, UA: 6 (ref 5.0–8.0)

## 2021-03-08 LAB — CMP12+LP+TP+TSH+6AC+PSA+CBC…
ALT: 29 IU/L (ref 0–44)
AST: 31 IU/L (ref 0–40)
Albumin/Globulin Ratio: 1.9 (ref 1.2–2.2)
Albumin: 4.4 g/dL (ref 4.0–5.0)
Alkaline Phosphatase: 71 IU/L (ref 44–121)
BUN/Creatinine Ratio: 13 (ref 9–20)
BUN: 12 mg/dL (ref 6–24)
Basophils Absolute: 0 10*3/uL (ref 0.0–0.2)
Basos: 1 %
Bilirubin Total: 0.7 mg/dL (ref 0.0–1.2)
Calcium: 9.4 mg/dL (ref 8.7–10.2)
Chloride: 100 mmol/L (ref 96–106)
Chol/HDL Ratio: 2.2 ratio (ref 0.0–5.0)
Cholesterol, Total: 174 mg/dL (ref 100–199)
Creatinine, Ser: 0.94 mg/dL (ref 0.76–1.27)
EOS (ABSOLUTE): 0.4 10*3/uL (ref 0.0–0.4)
Eos: 5 %
Estimated CHD Risk: <0.5 times avg. (ref 0.0–1.0)
Free Thyroxine Index: 2.1 (ref 1.2–4.9)
GGT: 25 IU/L (ref 0–65)
Globulin, Total: 2.3 g/dL (ref 1.5–4.5)
Glucose: 126 mg/dL — ABNORMAL HIGH (ref 65–99)
HDL: 78 mg/dL (ref >39)
Hematocrit: 38.7 % (ref 37.5–51.0)
Hemoglobin: 13.5 g/dL (ref 13.0–17.7)
Immature Grans (Abs): 0 10*3/uL (ref 0.0–0.1)
Immature Granulocytes: 0 %
Iron: 173 ug/dL — ABNORMAL HIGH (ref 38–169)
LDH: 183 IU/L (ref 121–224)
LDL Chol Calc (NIH): 79 mg/dL (ref 0–99)
Lymphocytes Absolute: 2.4 10*3/uL (ref 0.7–3.1)
Lymphs: 34 %
MCH: 32.2 pg (ref 26.6–33.0)
MCHC: 34.9 g/dL (ref 31.5–35.7)
MCV: 92 fL (ref 79–97)
Monocytes Absolute: 0.6 10*3/uL (ref 0.1–0.9)
Monocytes: 9 %
Neutrophils Absolute: 3.7 10*3/uL (ref 1.4–7.0)
Neutrophils: 51 %
Phosphorus: 3.6 mg/dL (ref 2.8–4.1)
Platelets: 249 10*3/uL (ref 150–450)
Potassium: 4.2 mmol/L (ref 3.5–5.2)
Prostate Specific Ag, Serum: 1.3 ng/mL (ref 0.0–4.0)
RBC: 4.19 x10E6/uL (ref 4.14–5.80)
RDW: 11.7 % (ref 11.6–15.4)
Sodium: 137 mmol/L (ref 134–144)
T3 Uptake Ratio: 29 % (ref 24–39)
T4, Total: 7.2 ug/dL (ref 4.5–12.0)
TSH: 1.18 u[IU]/mL (ref 0.450–4.500)
Total Protein: 6.7 g/dL (ref 6.0–8.5)
Triglycerides: 93 mg/dL (ref 0–149)
Uric Acid: 4.6 mg/dL (ref 3.8–8.4)
VLDL Cholesterol Cal: 17 mg/dL (ref 5–40)
WBC: 7.2 10*3/uL (ref 3.4–10.8)
eGFR: 100 mL/min/{1.73_m2} (ref >59)

## 2021-03-10 LAB — SPECIMEN STATUS REPORT

## 2021-03-10 LAB — HGB A1C W/O EAG: Hgb A1c MFr Bld: 5.5 % (ref 4.8–5.6)

## 2021-03-16 ENCOUNTER — Other Ambulatory Visit: Payer: Self-pay

## 2021-03-16 ENCOUNTER — Encounter: Payer: Self-pay | Admitting: Physician Assistant

## 2021-03-16 ENCOUNTER — Ambulatory Visit: Payer: Self-pay | Admitting: Physician Assistant

## 2021-03-16 VITALS — BP 142/97 | HR 83 | Temp 97.6°F | Resp 14 | Ht 72.0 in | Wt 203.0 lb

## 2021-03-16 DIAGNOSIS — Z0000 Encounter for general adult medical examination without abnormal findings: Secondary | ICD-10-CM

## 2021-03-16 NOTE — Progress Notes (Signed)
   Subjective: Annual firefighter exam    Patient ID: [name], male    DOB: [birth year], 50 y.o.   MRN: [id number]  HPI Patient presents for annual firefighter exam.  Patient voices no concerns or complaints.   Review of Systems Hyperlipidemia hypertension.    Objective:   Physical Exam  No acute distress.  Temperature 97.6, pulse 83, respiration 14, BP is 142/97.  Patient weighs 203 pounds and BMI is 27.5. HEENT is unremarkable.  Neck is supple for adenopathy or bruits.  Lungs are clear to auscultation.  Heart is regular rate and rhythm.  No acute findings on EKG. Abdomen negative HSM, normoactive bowel sounds, soft, nontender to palpation. No obvious deformity to the upper or lower extremities.  Patient has full and equal range of motion of the upper and lower extremities. No obvious cervical or lumbar spine deformity.  Patient has full and equal range of motion of the cervical and lumbar spine. Cranial nerves II through XII are grossly intact.  DTR 2+ without clonus.      Assessment & Plan: Well exam.   Discussed lab results with patient.  Advised continue previous medications.  Follow-up as necessary.

## 2021-03-16 NOTE — Progress Notes (Signed)
Pt requesting sample allergy medication from clinic due to drainage from allergies. CL,RMA

## 2021-04-09 ENCOUNTER — Emergency Department (HOSPITAL_COMMUNITY)
Admission: EM | Admit: 2021-04-09 | Discharge: 2021-04-09 | Disposition: A | Discharge status: Home / Self Care | Payer: 59 | Attending: Emergency Medicine | Admitting: Emergency Medicine

## 2021-04-09 ENCOUNTER — Other Ambulatory Visit: Payer: Self-pay

## 2021-04-09 ENCOUNTER — Encounter (HOSPITAL_COMMUNITY): Payer: Self-pay | Admitting: Emergency Medicine

## 2021-04-09 ENCOUNTER — Emergency Department (HOSPITAL_COMMUNITY): Payer: 59

## 2021-04-09 DIAGNOSIS — Z79899 Other long term (current) drug therapy: Secondary | ICD-10-CM | POA: Insufficient documentation

## 2021-04-09 DIAGNOSIS — I10 Essential (primary) hypertension: Secondary | ICD-10-CM | POA: Diagnosis not present

## 2021-04-09 DIAGNOSIS — M546 Pain in thoracic spine: Secondary | ICD-10-CM | POA: Diagnosis not present

## 2021-04-09 DIAGNOSIS — F17290 Nicotine dependence, other tobacco product, uncomplicated: Secondary | ICD-10-CM | POA: Diagnosis not present

## 2021-04-09 DIAGNOSIS — R079 Chest pain, unspecified: Secondary | ICD-10-CM | POA: Insufficient documentation

## 2021-04-09 DIAGNOSIS — R69 Illness, unspecified: Secondary | ICD-10-CM | POA: Diagnosis not present

## 2021-04-09 DIAGNOSIS — R0789 Other chest pain: Secondary | ICD-10-CM | POA: Diagnosis not present

## 2021-04-09 LAB — BASIC METABOLIC PANEL
Anion gap: 6 (ref 5–15)
BUN: 11 mg/dL (ref 6–20)
CO2: 28 mmol/L (ref 22–32)
Calcium: 9.2 mg/dL (ref 8.9–10.3)
Chloride: 102 mmol/L (ref 98–111)
Creatinine, Ser: 0.96 mg/dL (ref 0.61–1.24)
GFR, Estimated: >60 mL/min (ref >60)
Glucose, Bld: 113 mg/dL — ABNORMAL HIGH (ref 70–99)
Potassium: 4 mmol/L (ref 3.5–5.1)
Sodium: 136 mmol/L (ref 135–145)

## 2021-04-09 LAB — CBC
HCT: 42.2 % (ref 39.0–52.0)
Hemoglobin: 14.3 g/dL (ref 13.0–17.0)
MCH: 33 pg (ref 26.0–34.0)
MCHC: 33.9 g/dL (ref 30.0–36.0)
MCV: 97.5 fL (ref 80.0–100.0)
Platelets: 284 10*3/uL (ref 150–400)
RBC: 4.33 MIL/uL (ref 4.22–5.81)
RDW: 12 % (ref 11.5–15.5)
WBC: 6.9 10*3/uL (ref 4.0–10.5)
nRBC: 0 % (ref 0.0–0.2)

## 2021-04-09 LAB — TROPONIN I (HIGH SENSITIVITY)
Troponin I (High Sensitivity): 3 ng/L (ref <18)
Troponin I (High Sensitivity): 2 ng/L (ref <18)

## 2021-04-09 MED ORDER — CYCLOBENZAPRINE HCL 10 MG PO TABS: 10.0000 mg | ORAL_TABLET | Freq: Two times a day (BID) | ORAL | 0 refills | Status: DC | PRN

## 2021-04-09 NOTE — Discharge Instructions (Signed)
Your tests are normal, your heart tests are normal, your chest x-ray is normal, your EKG is normal, this is reassuring, you may take Flexeril as needed if you feel like your back is spasming.  Otherwise follow-up with your cardiologist.  ER for worsening symptoms

## 2021-04-09 NOTE — ED Triage Notes (Signed)
Pt reports left sided chest pressure. Pt reports this started as left sided back pain that has radiated to his chest.
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
## 2021-04-10 ENCOUNTER — Telehealth: Payer: Self-pay | Admitting: *Deleted

## 2021-04-10 DIAGNOSIS — R072 Precordial pain: Secondary | ICD-10-CM

## 2021-04-10 MED ORDER — METOPROLOL TARTRATE 100 MG PO TABS: ORAL_TABLET | ORAL | 0 refills | Status: DC

## 2021-04-10 NOTE — Telephone Encounter (Signed)
Jettie Booze, MD  Thompson Grayer, RN Can we change his CTA on 10/19 to a CTA coronaries.  They should still see the aorta.

## 2021-04-10 NOTE — Telephone Encounter (Signed)
Patient notified.  His BP was 148/102 and heart rate 73 in ED.  Readings were 110/86, 82 when he saw Dr Irish Lack last.  Patient states this is normal heart rate for him.  Lopressor dose is 100 mg prior to CT.  Prescription sent to [facility] in [place].  I verbally went over CT instructions with patient and will send copy through my chart.

## 2021-04-12 ENCOUNTER — Other Ambulatory Visit (HOSPITAL_COMMUNITY): Payer: Self-pay | Admitting: Interventional Cardiology

## 2021-04-12 DIAGNOSIS — Q231 Congenital insufficiency of aortic valve: Secondary | ICD-10-CM

## 2021-04-13 ENCOUNTER — Telehealth (HOSPITAL_COMMUNITY): Payer: Self-pay | Admitting: Emergency Medicine

## 2021-04-13 NOTE — Telephone Encounter (Signed)
Reaching out to patient to offer assistance regarding upcoming cardiac imaging study; pt verbalizes understanding of appt date/time, parking situation and where to check in, pre-test NPO status and medications ordered, and verified current allergies; name and call back number provided for further questions should they arise Kyle Bond RN Navigator Cardiac Imaging [facility] [phone] office [phone] cell  100mg  metoprolol tart (holding lisinopril-HCTZ, flonase) Denies iv issues

## 2021-04-17 ENCOUNTER — Ambulatory Visit (HOSPITAL_COMMUNITY)
Admission: RE | Admit: 2021-04-17 | Discharge: 2021-04-17 | Disposition: A | Discharge status: Home / Self Care | Payer: 59 | Source: Ambulatory Visit | Attending: Interventional Cardiology | Admitting: Interventional Cardiology

## 2021-04-17 ENCOUNTER — Other Ambulatory Visit: Payer: Self-pay

## 2021-04-17 DIAGNOSIS — I77810 Thoracic aortic ectasia: Secondary | ICD-10-CM | POA: Diagnosis not present

## 2021-04-17 DIAGNOSIS — R072 Precordial pain: Secondary | ICD-10-CM | POA: Insufficient documentation

## 2021-04-17 MED ORDER — DILTIAZEM HCL 25 MG/5ML IV SOLN: 10.0000 mg | INTRAVENOUS | Status: DC | PRN

## 2021-04-17 MED ORDER — NITROGLYCERIN 0.4 MG SL SUBL
SUBLINGUAL_TABLET | SUBLINGUAL | Status: AC
  Administered 2021-04-17: 0.8 mg via SUBLINGUAL
  Filled 2021-04-17: qty 2

## 2021-04-17 MED ORDER — IOHEXOL 350 MG/ML SOLN
95.0000 mL | Freq: Once | INTRAVENOUS | Status: AC | PRN
  Administered 2021-04-17: 95 mL via INTRAVENOUS

## 2021-04-17 MED ORDER — NITROGLYCERIN 0.4 MG SL SUBL: 0.8000 mg | SUBLINGUAL_TABLET | Freq: Once | SUBLINGUAL | Status: AC

## 2021-04-17 MED ORDER — DILTIAZEM HCL 25 MG/5ML IV SOLN
INTRAVENOUS | Status: AC
  Administered 2021-04-17: 10 mg via INTRAVENOUS
  Filled 2021-04-17: qty 5

## 2021-04-17 MED ORDER — METOPROLOL TARTRATE 5 MG/5ML IV SOLN: 10.0000 mg | INTRAVENOUS | Status: DC | PRN

## 2021-04-19 ENCOUNTER — Other Ambulatory Visit: Payer: Self-pay | Admitting: *Deleted

## 2021-04-19 ENCOUNTER — Ambulatory Visit (HOSPITAL_COMMUNITY): Payer: 59

## 2021-04-19 DIAGNOSIS — I7121 Aneurysm of the ascending aorta, without rupture: Secondary | ICD-10-CM

## 2021-04-26 ENCOUNTER — Other Ambulatory Visit: Payer: 59

## 2021-07-23 ENCOUNTER — Other Ambulatory Visit: Payer: Self-pay | Admitting: Interventional Cardiology

## 2021-08-16 ENCOUNTER — Other Ambulatory Visit: Payer: Self-pay | Admitting: Interventional Cardiology
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
## 2021-09-20 ENCOUNTER — Ambulatory Visit: Payer: 59 | Admitting: Interventional Cardiology

## 2021-09-20 DIAGNOSIS — I10 Essential (primary) hypertension: Secondary | ICD-10-CM

## 2021-09-20 DIAGNOSIS — E782 Mixed hyperlipidemia: Secondary | ICD-10-CM

## 2021-09-20 DIAGNOSIS — I359 Nonrheumatic aortic valve disorder, unspecified: Secondary | ICD-10-CM

## 2021-09-20 DIAGNOSIS — I7121 Aneurysm of the ascending aorta, without rupture: Secondary | ICD-10-CM

## 2021-09-20 DIAGNOSIS — F17200 Nicotine dependence, unspecified, uncomplicated: Secondary | ICD-10-CM

## 2021-12-02 ENCOUNTER — Other Ambulatory Visit: Payer: Self-pay | Admitting: Interventional Cardiology
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
## 2022-01-10 ENCOUNTER — Encounter: Payer: Self-pay | Admitting: Interventional Cardiology

## 2022-01-10 ENCOUNTER — Ambulatory Visit (INDEPENDENT_AMBULATORY_CARE_PROVIDER_SITE_OTHER): Payer: 59 | Admitting: Interventional Cardiology

## 2022-01-10 VITALS — BP 110/74 | HR 73 | Ht 72.0 in | Wt 201.0 lb

## 2022-01-10 DIAGNOSIS — F17200 Nicotine dependence, unspecified, uncomplicated: Secondary | ICD-10-CM | POA: Diagnosis not present

## 2022-01-10 DIAGNOSIS — I359 Nonrheumatic aortic valve disorder, unspecified: Secondary | ICD-10-CM | POA: Diagnosis not present

## 2022-01-10 DIAGNOSIS — I7121 Aneurysm of the ascending aorta, without rupture: Secondary | ICD-10-CM

## 2022-01-10 DIAGNOSIS — E782 Mixed hyperlipidemia: Secondary | ICD-10-CM | POA: Diagnosis not present

## 2022-01-10 DIAGNOSIS — R69 Illness, unspecified: Secondary | ICD-10-CM | POA: Diagnosis not present

## 2022-01-10 DIAGNOSIS — I10 Essential (primary) hypertension: Secondary | ICD-10-CM

## 2022-01-10 MED ORDER — ROSUVASTATIN CALCIUM 20 MG PO TABS: 20.0000 mg | ORAL_TABLET | Freq: Every day | ORAL | 3 refills | Status: DC

## 2022-01-10 NOTE — Patient Instructions (Addendum)
Medication Instructions:  Your physician has recommended you make the following change in your medication:  1-STOP Pravastatin 2-START Crestor (Rosuvastatin) 20 mg by mouth daily.  *If you need a refill on your cardiac medications before your next appointment, please call your pharmacy*  Lab Work: If you have labs (blood work) drawn today and your tests are completely normal, you will receive your results only by: [place] (if you have MyChart) OR A paper copy in the mail If you have any lab test that is abnormal or we need to change your treatment, we will call you to review the results.  Testing/Procedures: None ordered today.  Follow-Up: At [facility], you and your health needs are our priority.  As part of our continuing mission to provide you with exceptional heart care, we have created designated Provider Care Teams.  These Care Teams include your primary Cardiologist (physician) and Advanced Practice Providers (APPs -  Physician Assistants and Nurse Practitioners) who all work together to provide you with the care you need, when you need it.  We recommend signing up for the patient portal called "MyChart".  Sign up information is provided on this After Visit Summary.  MyChart is used to connect with patients for Virtual Visits (Telemedicine).  Patients are able to view lab/test results, encounter notes, upcoming appointments, etc.  Non-urgent messages can be sent to your provider as well.   To learn more about what you can do with MyChart, go to [email or web address].    Your next appointment:   1 year(s)  The format for your next appointment:   In Person  Provider:   Larae Grooms, MD {  Other Instructions Please get your Lipid Profile done with your regular lab work at the [facility] and fax our office a copy at [phone]. Important Information About Sugar

## 2022-03-07 ENCOUNTER — Other Ambulatory Visit: Payer: Self-pay | Admitting: Interventional Cardiology

## 2022-03-14 ENCOUNTER — Ambulatory Visit: Payer: 59

## 2022-03-14 DIAGNOSIS — Z0000 Encounter for general adult medical examination without abnormal findings: Secondary | ICD-10-CM

## 2022-03-14 LAB — POCT URINALYSIS DIPSTICK
Bilirubin, UA: NEGATIVE
Blood, UA: NEGATIVE
Glucose, UA: NEGATIVE
Ketones, UA: NEGATIVE
Leukocytes, UA: NEGATIVE
Nitrite, UA: NEGATIVE
Protein, UA: NEGATIVE
Spec Grav, UA: 1.015 (ref 1.010–1.025)
Urobilinogen, UA: 0.2 E.U./dL
pH, UA: 6.5 (ref 5.0–8.0)

## 2022-03-14 NOTE — Progress Notes (Signed)
Pt presents today for [employer] physical labs, will return to clinic for scheduled physical./CL,RMA

## 2022-03-15 LAB — CMP12+LP+TP+TSH+6AC+PSA+CBC…
ALT: 34 IU/L (ref 0–44)
AST: 26 IU/L (ref 0–40)
Albumin/Globulin Ratio: 1.7 (ref 1.2–2.2)
Albumin: 4.5 g/dL (ref 4.1–5.1)
Alkaline Phosphatase: 75 IU/L (ref 44–121)
BUN/Creatinine Ratio: 13 (ref 9–20)
BUN: 11 mg/dL (ref 6–24)
Basophils Absolute: 0 x10E3/uL (ref 0.0–0.2)
Basos: 1 %
Bilirubin Total: 0.5 mg/dL (ref 0.0–1.2)
Calcium: 9.8 mg/dL (ref 8.7–10.2)
Chloride: 103 mmol/L (ref 96–106)
Chol/HDL Ratio: 2.4 ratio (ref 0.0–5.0)
Cholesterol, Total: 144 mg/dL (ref 100–199)
Creatinine, Ser: 0.86 mg/dL (ref 0.76–1.27)
EOS (ABSOLUTE): 0.5 x10E3/uL — ABNORMAL HIGH (ref 0.0–0.4)
Eos: 7 %
Estimated CHD Risk: <0.5 times avg. (ref 0.0–1.0)
Free Thyroxine Index: 2.1 (ref 1.2–4.9)
GGT: 21 IU/L (ref 0–65)
Globulin, Total: 2.6 g/dL (ref 1.5–4.5)
Glucose: 130 mg/dL — ABNORMAL HIGH (ref 70–99)
HDL: 59 mg/dL (ref >39)
Hematocrit: 40.9 % (ref 37.5–51.0)
Hemoglobin: 14 g/dL (ref 13.0–17.7)
Immature Grans (Abs): 0 x10E3/uL (ref 0.0–0.1)
Immature Granulocytes: 0 %
Iron: 128 ug/dL (ref 38–169)
LDH: 168 IU/L (ref 121–224)
LDL Chol Calc (NIH): 72 mg/dL (ref 0–99)
Lymphocytes Absolute: 2 x10E3/uL (ref 0.7–3.1)
Lymphs: 27 %
MCH: 31.3 pg (ref 26.6–33.0)
MCHC: 34.2 g/dL (ref 31.5–35.7)
MCV: 91 fL (ref 79–97)
Monocytes Absolute: 0.5 x10E3/uL (ref 0.1–0.9)
Monocytes: 6 %
Neutrophils Absolute: 4.3 x10E3/uL (ref 1.4–7.0)
Neutrophils: 59 %
Phosphorus: 3.7 mg/dL (ref 2.8–4.1)
Platelets: 289 x10E3/uL (ref 150–450)
Potassium: 4.6 mmol/L (ref 3.5–5.2)
Prostate Specific Ag, Serum: 1.2 ng/mL (ref 0.0–4.0)
RBC: 4.48 x10E6/uL (ref 4.14–5.80)
RDW: 11.2 % — ABNORMAL LOW (ref 11.6–15.4)
Sodium: 141 mmol/L (ref 134–144)
T3 Uptake Ratio: 26 % (ref 24–39)
T4, Total: 7.9 ug/dL (ref 4.5–12.0)
TSH: 0.781 u[IU]/mL (ref 0.450–4.500)
Total Protein: 7.1 g/dL (ref 6.0–8.5)
Triglycerides: 64 mg/dL (ref 0–149)
Uric Acid: 4.5 mg/dL (ref 3.8–8.4)
VLDL Cholesterol Cal: 13 mg/dL (ref 5–40)
WBC: 7.3 x10E3/uL (ref 3.4–10.8)
eGFR: 106 mL/min/1.73 (ref >59)

## 2022-03-15 NOTE — Addendum Note (Signed)
Addended by: Aliene Altes on: 03/15/2022 02:39 PM   Modules accepted: Orders

## 2022-03-17 LAB — SPECIMEN STATUS REPORT

## 2022-03-17 LAB — HGB A1C W/O EAG: Hgb A1c MFr Bld: 5.9 % — ABNORMAL HIGH (ref 4.8–5.6)

## 2022-03-20 ENCOUNTER — Ambulatory Visit: Payer: 59

## 2022-03-20 ENCOUNTER — Ambulatory Visit: Payer: 59 | Admitting: Physician Assistant

## 2022-03-20 ENCOUNTER — Encounter: Payer: Self-pay | Admitting: Physician Assistant

## 2022-03-20 ENCOUNTER — Ambulatory Visit: Payer: Self-pay | Admitting: Physician Assistant

## 2022-03-20 VITALS — BP 120/90 | HR 89 | Temp 97.5°F | Resp 14 | Ht 72.0 in | Wt 202.0 lb

## 2022-03-20 DIAGNOSIS — Z0000 Encounter for general adult medical examination without abnormal findings: Secondary | ICD-10-CM

## 2022-03-20 NOTE — Progress Notes (Signed)
Pt presents today to complete [employer] pre-employment physical. Pt denies any issues or concerns./CL,RMA
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
## 2022-04-13 ENCOUNTER — Ambulatory Visit (HOSPITAL_BASED_OUTPATIENT_CLINIC_OR_DEPARTMENT_OTHER): Payer: 59

## 2022-04-13 ENCOUNTER — Inpatient Hospital Stay: Admission: RE | Admit: 2022-04-13 | Payer: 59 | Source: Ambulatory Visit

## 2022-04-19 ENCOUNTER — Encounter (HOSPITAL_BASED_OUTPATIENT_CLINIC_OR_DEPARTMENT_OTHER): Payer: Self-pay

## 2022-04-19 ENCOUNTER — Ambulatory Visit (HOSPITAL_BASED_OUTPATIENT_CLINIC_OR_DEPARTMENT_OTHER)
Admission: RE | Admit: 2022-04-19 | Discharge: 2022-04-19 | Disposition: A | Discharge status: Home / Self Care | Payer: 59 | Source: Ambulatory Visit | Attending: Interventional Cardiology | Admitting: Interventional Cardiology

## 2022-04-19 DIAGNOSIS — I7121 Aneurysm of the ascending aorta, without rupture: Secondary | ICD-10-CM | POA: Insufficient documentation

## 2022-04-19 MED ORDER — IOHEXOL 350 MG/ML SOLN
100.0000 mL | Freq: Once | INTRAVENOUS | Status: AC | PRN
  Administered 2022-04-19: 75 mL via INTRAVENOUS

## 2022-04-26 ENCOUNTER — Other Ambulatory Visit: Payer: Self-pay | Admitting: *Deleted

## 2022-04-26 DIAGNOSIS — I7121 Aneurysm of the ascending aorta, without rupture: Secondary | ICD-10-CM

## 2022-07-24 ENCOUNTER — Encounter: Payer: Self-pay | Admitting: Gastroenterology

## 2023-01-11 ENCOUNTER — Other Ambulatory Visit: Payer: Self-pay | Admitting: Interventional Cardiology

## 2023-02-13 ENCOUNTER — Ambulatory Visit: Payer: Self-pay

## 2023-02-13 DIAGNOSIS — Z0289 Encounter for other administrative examinations: Secondary | ICD-10-CM

## 2023-02-13 LAB — POCT URINALYSIS DIPSTICK
Bilirubin, UA: NEGATIVE
Blood, UA: NEGATIVE
Glucose, UA: NEGATIVE
Ketones, UA: NEGATIVE
Leukocytes, UA: NEGATIVE
Nitrite, UA: NEGATIVE
Protein, UA: NEGATIVE
Spec Grav, UA: 1.01 (ref 1.010–1.025)
Urobilinogen, UA: 0.2 E.U./dL
pH, UA: 6 (ref 5.0–8.0)

## 2023-02-13 NOTE — Progress Notes (Signed)
Pt completed labs for FF physical./CL,RMA 

## 2023-02-17 ENCOUNTER — Other Ambulatory Visit: Payer: Self-pay | Admitting: Interventional Cardiology

## 2023-02-19 ENCOUNTER — Encounter: Payer: Self-pay | Admitting: Physician Assistant

## 2023-02-19 ENCOUNTER — Ambulatory Visit: Payer: Self-pay | Admitting: Physician Assistant

## 2023-02-19 VITALS — BP 143/99 | HR 84 | Temp 97.6°F | Resp 14 | Ht 72.0 in | Wt 205.0 lb

## 2023-02-19 DIAGNOSIS — Z0000 Encounter for general adult medical examination without abnormal findings: Secondary | ICD-10-CM

## 2023-02-19 NOTE — Progress Notes (Signed)
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
## 2023-03-07 ENCOUNTER — Other Ambulatory Visit: Payer: Self-pay | Admitting: Interventional Cardiology

## 2023-03-15 ENCOUNTER — Other Ambulatory Visit: Payer: Self-pay

## 2023-03-15 MED ORDER — ROSUVASTATIN CALCIUM 20 MG PO TABS: 20.0000 mg | ORAL_TABLET | Freq: Every day | ORAL | 0 refills | Status: DC

## 2023-03-15 MED ORDER — LISINOPRIL-HYDROCHLOROTHIAZIDE 10-12.5 MG PO TABS: 1.0000 | ORAL_TABLET | Freq: Every day | ORAL | 0 refills | Status: DC

## 2023-04-11 ENCOUNTER — Other Ambulatory Visit: Payer: Self-pay | Admitting: Interventional Cardiology

## 2023-05-11 ENCOUNTER — Encounter (HOSPITAL_COMMUNITY): Payer: Self-pay

## 2023-05-11 ENCOUNTER — Other Ambulatory Visit: Payer: Self-pay

## 2023-05-11 ENCOUNTER — Emergency Department (HOSPITAL_COMMUNITY): Payer: 59

## 2023-05-11 ENCOUNTER — Emergency Department (HOSPITAL_COMMUNITY)
Admission: EM | Admit: 2023-05-11 | Discharge: 2023-05-11 | Disposition: A | Discharge status: Home / Self Care | Payer: 59 | Attending: Emergency Medicine | Admitting: Emergency Medicine

## 2023-05-11 DIAGNOSIS — Z79899 Other long term (current) drug therapy: Secondary | ICD-10-CM | POA: Diagnosis not present

## 2023-05-11 DIAGNOSIS — K5792 Diverticulitis of intestine, part unspecified, without perforation or abscess without bleeding: Secondary | ICD-10-CM

## 2023-05-11 DIAGNOSIS — R1030 Lower abdominal pain, unspecified: Secondary | ICD-10-CM | POA: Diagnosis not present

## 2023-05-11 DIAGNOSIS — K5730 Diverticulosis of large intestine without perforation or abscess without bleeding: Secondary | ICD-10-CM | POA: Diagnosis not present

## 2023-05-11 DIAGNOSIS — D72829 Elevated white blood cell count, unspecified: Secondary | ICD-10-CM | POA: Insufficient documentation

## 2023-05-11 DIAGNOSIS — R1031 Right lower quadrant pain: Secondary | ICD-10-CM | POA: Diagnosis not present

## 2023-05-11 DIAGNOSIS — Z20822 Contact with and (suspected) exposure to covid-19: Secondary | ICD-10-CM | POA: Diagnosis not present

## 2023-05-11 DIAGNOSIS — I10 Essential (primary) hypertension: Secondary | ICD-10-CM | POA: Diagnosis not present

## 2023-05-11 LAB — RESP PANEL BY RT-PCR (RSV, FLU A&B, COVID)  RVPGX2
Influenza A by PCR: NEGATIVE
Influenza B by PCR: NEGATIVE
Resp Syncytial Virus by PCR: NEGATIVE
SARS Coronavirus 2 by RT PCR: NEGATIVE

## 2023-05-11 LAB — URINALYSIS, ROUTINE W REFLEX MICROSCOPIC
Bilirubin Urine: NEGATIVE
Glucose, UA: NEGATIVE mg/dL
Hgb urine dipstick: NEGATIVE
Ketones, ur: 5 mg/dL — AB
Leukocytes,Ua: NEGATIVE
Nitrite: NEGATIVE
Protein, ur: NEGATIVE mg/dL
Specific Gravity, Urine: 1.011 (ref 1.005–1.030)
pH: 7 (ref 5.0–8.0)

## 2023-05-11 LAB — COMPREHENSIVE METABOLIC PANEL
ALT: 34 U/L (ref 0–44)
AST: 21 U/L (ref 15–41)
Albumin: 4 g/dL (ref 3.5–5.0)
Alkaline Phosphatase: 53 U/L (ref 38–126)
Anion gap: 9 (ref 5–15)
BUN: 12 mg/dL (ref 6–20)
CO2: 27 mmol/L (ref 22–32)
Calcium: 9.3 mg/dL (ref 8.9–10.3)
Chloride: 100 mmol/L (ref 98–111)
Creatinine, Ser: 0.93 mg/dL (ref 0.61–1.24)
GFR, Estimated: >60 mL/min (ref >60)
Glucose, Bld: 126 mg/dL — ABNORMAL HIGH (ref 70–99)
Potassium: 3.9 mmol/L (ref 3.5–5.1)
Sodium: 136 mmol/L (ref 135–145)
Total Bilirubin: 0.7 mg/dL (ref 0.3–1.2)
Total Protein: 7.5 g/dL (ref 6.5–8.1)

## 2023-05-11 LAB — CBC
HCT: 38.5 % — ABNORMAL LOW (ref 39.0–52.0)
Hemoglobin: 12.9 g/dL — ABNORMAL LOW (ref 13.0–17.0)
MCH: 32 pg (ref 26.0–34.0)
MCHC: 33.5 g/dL (ref 30.0–36.0)
MCV: 95.5 fL (ref 80.0–100.0)
Platelets: 280 10*3/uL (ref 150–400)
RBC: 4.03 MIL/uL — ABNORMAL LOW (ref 4.22–5.81)
RDW: 11.9 % (ref 11.5–15.5)
WBC: 14.2 10*3/uL — ABNORMAL HIGH (ref 4.0–10.5)
nRBC: 0 % (ref 0.0–0.2)

## 2023-05-11 LAB — LIPASE, BLOOD: Lipase: 22 U/L (ref 11–51)

## 2023-05-11 MED ORDER — PIPERACILLIN-TAZOBACTAM 3.375 G IVPB 30 MIN
3.3750 g | Freq: Once | INTRAVENOUS | Status: AC
  Administered 2023-05-11: 3.375 g via INTRAVENOUS
  Filled 2023-05-11: qty 50

## 2023-05-11 MED ORDER — IOHEXOL 350 MG/ML SOLN
75.0000 mL | Freq: Once | INTRAVENOUS | Status: AC | PRN
  Administered 2023-05-11: 75 mL via INTRAVENOUS

## 2023-05-11 MED ORDER — AMOXICILLIN-POT CLAVULANATE 875-125 MG PO TABS: 1.0000 | ORAL_TABLET | Freq: Two times a day (BID) | ORAL | 0 refills | Status: DC

## 2023-05-11 NOTE — Discharge Instructions (Addendum)
Take augmentin twice daily for a week for diverticulitis   See your doctor for follow up   I recommend that you follow-up with GI doctor for colonoscopy  Return to ER if you have severe abdominal pain or vomiting or fever

## 2023-05-11 NOTE — ED Provider Notes (Signed)
[place] EMERGENCY DEPARTMENT AT [facility] Provider Note   CSN: [id number] Arrival date & time: 05/11/23  1418     History  Chief Complaint  Patient presents with   Abdominal Pain    [name] is a 52 y.o. male with history of hypertension, diverticulitis, presents with concern for lower abdominal pain since yesterday.  States he has a crampy feeling that comes and goes.  Also notes a pressure while urinating, but no dysuria, hematuria, increased frequency.  Reports a loss of appetite today.  Has been having bowel movements, but reports some of them are more watery than normal.  Denies any bloody bowel movements.  Denies any fever chills, nausea or vomiting.   Abdominal Pain      Home Medications Prior to Admission medications   Medication Sig Start Date End Date Taking? Authorizing Provider  lisinopril-hydrochlorothiazide (ZESTORETIC) 10-12.5 MG tablet Take 1 tablet by mouth daily. Overdue yearly follow up.  PLEASE CALL OFFICE TO SCHEDULE APPOINTMENT PRIOR TO NEXT REFILL (first attempt) 04/11/23   Corky Crafts, MD  Multiple Vitamin (MULTI VITAMIN MENS PO) Take by mouth daily at 6 (six) AM.    [provider]  Omega-3 1000 MG CAPS Take by mouth daily.    [provider]  rosuvastatin (CRESTOR) 20 MG tablet Take 1 tablet (20 mg total) by mouth daily. TAKE 1 TABLET BY MOUTH EVERY DAY 03/15/23   Corky Crafts, MD      Allergies    Patient has no known allergies.    Review of Systems   Review of Systems  Gastrointestinal:  Positive for abdominal pain.    Physical Exam Updated Vital Signs BP (!) 135/94   Pulse 84   Temp 98.2 F (36.8 C) (Oral)   Resp 16   Ht 6' (1.829 m)   Wt 93 kg   SpO2 96%   BMI 27.80 kg/m  Physical Exam Vitals and nursing note reviewed.  Constitutional:      General: He is not in acute distress.    Appearance: He is well-developed.  HENT:     Head: Normocephalic and atraumatic.  Eyes:      Conjunctiva/sclera: Conjunctivae normal.  Cardiovascular:     Rate and Rhythm: Normal rate and regular rhythm.     Heart sounds: No murmur heard. Pulmonary:     Effort: Pulmonary effort is normal. No respiratory distress.     Breath sounds: Normal breath sounds.  Abdominal:     Palpations: Abdomen is soft.     Tenderness: There is no abdominal tenderness. There is no right CVA tenderness or left CVA tenderness.     Comments: Abdomen soft nontender, no rebound or guarding  Musculoskeletal:        General: No swelling.     Cervical back: Neck supple.  Skin:    General: Skin is warm and dry.     Capillary Refill: Capillary refill takes less than 2 seconds.  Neurological:     Mental Status: He is alert.  Psychiatric:        Mood and Affect: Mood normal.     ED Results / Procedures / Treatments   Labs (all labs ordered are listed, but only abnormal results are displayed) Labs Reviewed  COMPREHENSIVE METABOLIC PANEL - Abnormal; Notable for the following components:      Result Value   Glucose, Bld 126 (*)    All other components within normal limits  CBC - Abnormal; Notable for the  following components:   WBC 14.2 (*)    RBC 4.03 (*)    Hemoglobin 12.9 (*)    HCT 38.5 (*)    All other components within normal limits  URINALYSIS, ROUTINE W REFLEX MICROSCOPIC - Abnormal; Notable for the following components:   Ketones, ur 5 (*)    All other components within normal limits  RESP PANEL BY RT-PCR (RSV, FLU A&B, COVID)  RVPGX2  LIPASE, BLOOD    EKG None  Radiology No results found.  Procedures Procedures    Medications Ordered in ED Medications - No data to display  ED Course/ Medical Decision Making/ A&P                                 Medical Decision Making Amount and/or Complexity of Data Reviewed Labs: ordered. Radiology: ordered.     Differential diagnosis includes but is not limited to UTI, gastroenteritis, COVID, flu, diverticulitis, colitis  ED  Course:  Patient well-appearing, no acute distress.  His vital signs are stable aside from a slightly elevated blood pressure of 134/91.  He is afebrile, no tachycardia.  Abdomen soft and nontender to palpation, no rebound or guarding.  Patient still having bowel movements.  CBC remarkable for leukocytosis of 14.2.  CMP unremarkable, no elevation in LFTs, no electrolyte abnormalities, creatinine normal.  Lipase within normal limits.  Urinalysis without signs of UTI.  Flu, COVID, RSV negative.  I discussed with patient that we cannot rule out a diverticulitis or other intra-abdominal pathology at this time given his abdominal pain and leukocytosis.  Our CT scanner is unfortunately down at this time and you are unable to get a CT scan here.  I discussed transfer to [facility] or [facility] for further evaluation with CT scan.  Patient would like to wait to see if the CT scanner will come back up over the next 1 hour.  If not, would consider transfer. Upon re-evaluation, patient still well-appearing, normal vital signs.  I informed him that the CT scanner is still down and it is unknown when our CT scanner will be back up.  We will plan on transfer to [facility] for CT scan to rule out diverticulitis.  Dr. Theresia Lo at [facility] is accepting patient.  Patient stable to go POV.  Impression: Abdominal pain  Disposition:  Patient transferred to [facility], POV.  He is instructed to go directly to the emergency department for further evaluation and to get his CT abdomen pelvis.  Lab Tests: I Ordered, and personally interpreted labs.  The pertinent results include:   CBC with leukocytosis of 14.2 CMP and lipase unremarkable Urinalysis unremarkable            Final Clinical Impression(s) / ED Diagnoses Final diagnoses:  Lower abdominal pain    Rx / DC Orders ED Discharge Orders     None         Arabella Merles, Cordelia Poche 05/11/23 1915    Lonell Grandchild, MD 05/12/23 [phone]

## 2023-05-11 NOTE — ED Notes (Signed)
Attempted to call report to [facility] charge No answer

## 2023-05-11 NOTE — ED Provider Notes (Signed)
  Physical Exam  BP (!) 149/98 (BP Location: Right Arm)   Pulse 87   Temp 99 F (37.2 C) (Oral)   Resp 16   Ht 6' (1.829 m)   Wt 93 kg   SpO2 97%   BMI 27.80 kg/m   Physical Exam  Procedures  Procedures  ED Course / MDM    Medical Decision Making Patient is transferred from [facility] for CT to rule out diverticulitis.  10:39 PM CT showed acute diverticulitis with no abscess.  Given Zosyn.  Patient will be discharged on Augmentin.  Problems Addressed: Diverticulitis: acute illness or injury  Amount and/or Complexity of Data Reviewed Labs: ordered. Decision-making details documented in ED Course. Radiology: ordered and independent interpretation performed. Decision-making details documented in ED Course.  Risk Prescription drug management.          Charlynne Pander, MD 05/11/23 2239

## 2023-05-11 NOTE — ED Triage Notes (Signed)
Pt reports lower abd pain since yesterday and has not been able to have a BM today and has no appetite.  Pt denies andy nausea or vomiting.

## 2023-05-14 ENCOUNTER — Telehealth: Payer: Self-pay | Admitting: *Deleted

## 2023-05-14 DIAGNOSIS — I7121 Aneurysm of the ascending aorta, without rupture: Secondary | ICD-10-CM

## 2023-05-14 NOTE — Telephone Encounter (Signed)
Patient due for CTA of chest for one year follow up of aorta.  Order was placed on 04/26/22 and has expired.  New order placed.

## 2023-05-22 ENCOUNTER — Telehealth: Payer: Self-pay | Admitting: Gastroenterology

## 2023-05-22 NOTE — Telephone Encounter (Signed)
Inbound call from patient requesting to know if he is able to have endoscopy along with his 12/27 colonoscopy. States he has been having GERD. Also stated in 2018 when his last colonoscopy was done an endoscopy was done along with it. Patient requesting a call back to be advised. Please advise, thank you.

## 2023-05-22 NOTE — Telephone Encounter (Signed)
The pt has been advised that he would need an office visit to discuss EGD.  He would like to keep previsit and colon as planned with Dr Russella Dar. He has asked to be set up with Dr Meridee Score after the first of the year to discuss EGD.  Appt made for 08/06/23.

## 2023-05-23 ENCOUNTER — Ambulatory Visit (HOSPITAL_COMMUNITY): Payer: 59

## 2023-06-14 ENCOUNTER — Ambulatory Visit (AMBULATORY_SURGERY_CENTER): Payer: 59 | Admitting: *Deleted

## 2023-06-14 ENCOUNTER — Other Ambulatory Visit: Payer: Self-pay | Admitting: Interventional Cardiology

## 2023-06-14 ENCOUNTER — Other Ambulatory Visit (HOSPITAL_COMMUNITY): Payer: 59

## 2023-06-14 VITALS — Ht 72.0 in | Wt 205.0 lb

## 2023-06-14 DIAGNOSIS — Z860100 Personal history of colon polyps, unspecified: Secondary | ICD-10-CM

## 2023-06-14 MED ORDER — NA SULFATE-K SULFATE-MG SULF 17.5-3.13-1.6 GM/177ML PO SOLN: 1.0000 | Freq: Once | ORAL | 0 refills | Status: AC

## 2023-06-14 NOTE — Progress Notes (Signed)
Pt's name and DOB verified at the beginning of the pre-visit wit 2 identifiers  Pt denies any difficulty with ambulating,sitting, laying down or rolling side to side  Pt has no issues with ambulation   Pt has no issues moving head neck or swallowing  No egg or soy allergy known to patient   No issues known to pt with past sedation with any surgeries or procedures  Pt denies having issues being intubated  No FH of Malignant Hyperthermia  Pt is not on diet pills or shots  Pt is not on home 02   Pt is not on blood thinners   Pt denies issues with constipation   Pt is not on dialysis  Pt denise any abnormal heart rhythms   Pt denies any upcoming cardiac testing  Pt encouraged to use to use Singlecare or Goodrx to reduce cost   Patient's chart reviewed by Cathlyn Parsons CNRA prior to pre-visit and patient appropriate for the [facility].  Pre-visit completed and red dot placed by patient's name on their procedure day (on provider's schedule).  .  Visit by phone  Pt states weight is 205 lb  Instructed pt why it is important to and  to call if they have any changes in health or new medications. Directed them to the # given and on instructions.     Instructions reviewed. Pt given both [facility] main # and MD on call # prior to instructions.  Pt states understanding. Instructed to review again prior to procedure. Pt states they will.   Instructions sent by mail with coupon and by My Chart  Coupon sent via text to mobile phone and pt verified they received it

## 2023-06-20 ENCOUNTER — Ambulatory Visit (HOSPITAL_BASED_OUTPATIENT_CLINIC_OR_DEPARTMENT_OTHER)
Admission: RE | Admit: 2023-06-20 | Discharge: 2023-06-20 | Disposition: A | Discharge status: Home / Self Care | Payer: 59 | Source: Ambulatory Visit | Attending: Cardiology | Admitting: Cardiology

## 2023-06-20 DIAGNOSIS — K449 Diaphragmatic hernia without obstruction or gangrene: Secondary | ICD-10-CM | POA: Diagnosis not present

## 2023-06-20 DIAGNOSIS — I2510 Atherosclerotic heart disease of native coronary artery without angina pectoris: Secondary | ICD-10-CM | POA: Diagnosis not present

## 2023-06-20 DIAGNOSIS — M7989 Other specified soft tissue disorders: Secondary | ICD-10-CM | POA: Diagnosis not present

## 2023-06-20 DIAGNOSIS — I7121 Aneurysm of the ascending aorta, without rupture: Secondary | ICD-10-CM

## 2023-06-20 MED ORDER — IOHEXOL 350 MG/ML SOLN
100.0000 mL | Freq: Once | INTRAVENOUS | Status: AC | PRN
  Administered 2023-06-20: 75 mL via INTRAVENOUS

## 2023-06-24 ENCOUNTER — Other Ambulatory Visit (HOSPITAL_BASED_OUTPATIENT_CLINIC_OR_DEPARTMENT_OTHER): Payer: Self-pay

## 2023-06-24 DIAGNOSIS — I7121 Aneurysm of the ascending aorta, without rupture: Secondary | ICD-10-CM

## 2023-07-05 ENCOUNTER — Ambulatory Visit (AMBULATORY_SURGERY_CENTER): Payer: 59 | Admitting: Gastroenterology

## 2023-07-05 ENCOUNTER — Encounter: Payer: Self-pay | Admitting: Gastroenterology

## 2023-07-05 VITALS — BP 125/81 | HR 69 | Temp 98.1°F | Resp 17 | Ht 72.0 in | Wt 205.0 lb

## 2023-07-05 DIAGNOSIS — K640 First degree hemorrhoids: Secondary | ICD-10-CM | POA: Diagnosis not present

## 2023-07-05 DIAGNOSIS — Z800 Family history of malignant neoplasm of digestive organs: Secondary | ICD-10-CM | POA: Diagnosis not present

## 2023-07-05 DIAGNOSIS — Z1211 Encounter for screening for malignant neoplasm of colon: Secondary | ICD-10-CM

## 2023-07-05 DIAGNOSIS — I10 Essential (primary) hypertension: Secondary | ICD-10-CM | POA: Diagnosis not present

## 2023-07-05 DIAGNOSIS — E785 Hyperlipidemia, unspecified: Secondary | ICD-10-CM | POA: Diagnosis not present

## 2023-07-05 DIAGNOSIS — Z860101 Personal history of adenomatous and serrated colon polyps: Secondary | ICD-10-CM

## 2023-07-05 DIAGNOSIS — K5730 Diverticulosis of large intestine without perforation or abscess without bleeding: Secondary | ICD-10-CM

## 2023-07-05 MED ORDER — SODIUM CHLORIDE 0.9 % IV SOLN: 500.0000 mL | Freq: Once | INTRAVENOUS | Status: DC

## 2023-07-05 MED ORDER — PANTOPRAZOLE SODIUM 40 MG PO TBEC: 40.0000 mg | DELAYED_RELEASE_TABLET | Freq: Every day | ORAL | 3 refills | Status: DC

## 2023-07-05 NOTE — Op Note (Signed)
[facility] Patient Name: [name] Procedure Date: 07/05/2023 10:41 AM MRN: [id number] Endoscopist: Meryl Dare , MD, [phone] Age: 51 Referring MD:  Date of Birth: 1971/02/11 Gender: Male Account #: [id number] Procedure:                Colonoscopy Indications:              Surveillance: Personal history of adenomatous                            polyps on last colonoscopy > 5 years ago, Family                            history of colon cancer, 1st-degree relative Medicines:                Monitored Anesthesia Care Procedure:                Pre-Anesthesia Assessment:                           - Prior to the procedure, a History and Physical                            was performed, and patient medications and                            allergies were reviewed. The patient's tolerance of                            previous anesthesia was also reviewed. The risks                            and benefits of the procedure and the sedation                            options and risks were discussed with the patient.                            All questions were answered, and informed consent                            was obtained. Prior Anticoagulants: The patient has                            taken no anticoagulant or antiplatelet agents. ASA                            Grade Assessment: II - A patient with mild systemic                            disease. After reviewing the risks and benefits,                            the patient was deemed in satisfactory condition to  undergo the procedure.                           After obtaining informed consent, the colonoscope                            was passed under direct vision. Throughout the                            procedure, the patient's blood pressure, pulse, and                            oxygen saturations were monitored continuously. The                            CF [id number]  #[id number] was introduced through the anus                            and advanced to the the cecum, identified by                            appendiceal orifice and ileocecal valve. The                            ileocecal valve, appendiceal orifice, and rectum                            were photographed. The quality of the bowel                            preparation was adequate. The colonoscopy was                            performed without difficulty. The patient tolerated                            the procedure well. Scope In: 10:53:40 AM Scope Out: 11:08:15 AM Scope Withdrawal Time: 0 hours 11 minutes 53 seconds  Total Procedure Duration: 0 hours 14 minutes 35 seconds  Findings:                 The perianal and digital rectal examinations were                            normal.                           Multiple medium-mouthed and small-mouthed                            diverticula were found in the left colon. There was                            no evidence of diverticular bleeding.  Internal hemorrhoids were found during                            retroflexion. The hemorrhoids were small and Grade                            I (internal hemorrhoids that do not prolapse).                           The exam was otherwise without abnormality on                            direct and retroflexion views. Complications:            No immediate complications. Estimated blood loss:                            None. Estimated Blood Loss:     Estimated blood loss: none. Impression:               - Mild diverticulosis in the left colon.                           - Internal hemorrhoids.                           - The examination was otherwise normal on direct                            and retroflexion views.                           - No specimens collected. Recommendation:           - Repeat colonoscopy in 5 years for surveillance.                           -  Patient has a contact number available for                            emergencies. The signs and symptoms of potential                            delayed complications were discussed with the                            patient. Return to normal activities tomorrow.                            Written discharge instructions were provided to the                            patient.                           - Resume previous diet adding high fiber.                           -  Follow antireflux measures.                           - Continue present medications.                           - Pantoprazole 40 mg po qd, 1 year of refills.                           - GI follow up with Dr. Meridee Score in January for                            GERD mgmt as planned. Meryl Dare, MD 07/05/2023 11:18:07 AM This report has been signed electronically.

## 2023-07-05 NOTE — Patient Instructions (Addendum)
Follow anti reflux regimen for GERD- handout given to you today   Continue previous diet & medications   Handouts on diverticulosis, hemorrhoids ,& high fiber diet  given to you today.   Await pathology results on polyps removed     Pantoprazole 40 mg by mouth daily -sent RX to your pharmacy for you to pick up  GI follow up with Dr Meridee Score in January for GERD management    YOU HAD AN ENDOSCOPIC PROCEDURE TODAY AT THE [facility]:   Refer to the procedure report that was given to you for any specific questions about what was found during the examination.  If the procedure report does not answer your questions, please call your gastroenterologist to clarify.  If you requested that your care partner not be given the details of your procedure findings, then the procedure report has been included in a sealed envelope for you to review at your convenience later.  YOU SHOULD EXPECT: Some feelings of bloating in the abdomen. Passage of more gas than usual.  Walking can help get rid of the air that was put into your GI tract during the procedure and reduce the bloating. If you had a lower endoscopy (such as a colonoscopy or flexible sigmoidoscopy) you may notice spotting of blood in your stool or on the toilet paper. If you underwent a bowel prep for your procedure, you may not have a normal bowel movement for a few days.  Please Note:  You might notice some irritation and congestion in your nose or some drainage.  This is from the oxygen used during your procedure.  There is no need for concern and it should clear up in a day or so.  SYMPTOMS TO REPORT IMMEDIATELY:  Following lower endoscopy (colonoscopy or flexible sigmoidoscopy):  Excessive amounts of blood in the stool  Significant tenderness or worsening of abdominal pains  Swelling of the abdomen that is new, acute  Fever of 100F or higher   For urgent or emergent issues, a gastroenterologist can be reached at any hour  by calling [phone]. Do not use MyChart messaging for urgent concerns.    DIET:  We do recommend a small meal at first, but then you may proceed to your regular diet.  Drink plenty of fluids but you should avoid alcoholic beverages for 24 hours.  ACTIVITY:  You should plan to take it easy for the rest of today and you should NOT DRIVE or use heavy machinery until tomorrow (because of the sedation medicines used during the test).    FOLLOW UP: Our staff will call the number listed on your records the next business day following your procedure.  We will call around 7:15- 8:00 am to check on you and address any questions or concerns that you may have regarding the information given to you following your procedure. If we do not reach you, we will leave a message.     If any biopsies were taken you will be contacted by phone or by letter within the next 1-3 weeks.  Please call us at [phone] if you have not heard about the biopsies in 3 weeks.    SIGNATURES/CONFIDENTIALITY: You and/or your care partner have signed paperwork which will be entered into your electronic medical record.  These signatures attest to the fact that that the information above on your After Visit Summary has been reviewed and is understood.  Full responsibility of the confidentiality of this discharge information lies with you and/or  your care-partner.

## 2023-07-05 NOTE — Progress Notes (Signed)
Pt's states no medical or surgical changes since previsit or office visit. 
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
## 2023-07-08 ENCOUNTER — Telehealth: Payer: Self-pay | Admitting: *Deleted

## 2023-07-08 NOTE — Progress Notes (Deleted)
  Cardiology Office Note:  .   Date:  07/08/2023  ID:  [name], DOB October 03, 1970, MRN [id number] PCP: [facility]  [facility] Providers Cardiologist:  Candyce Reek, MD { Click to update primary MD,subspecialty MD or APP then REFRESH:1}   Patient Profile: .      PMH Bicuspid aortic valve Coronary artery calcification Mixed hyperlipidemia Hypertension Tobacco abuse GERD  He established with cardiology for known bicuspid aortic valve and has been followed by Dr. Reek.  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  He exercises regularly.  Smokes an occasional cigar.  Echocardiogram 10/20/2020 revealed LVEF 55 to 60%, normal diastolic parameters, normal RV, trivial MR, bicuspid aortic valve with mild calcification, trivial AI, mild dilatation of the ascending aorta measuring 43 mm, new from echo in 2014. He underwent coronary CTA 04/17/2021 which revealed coronary calcium  score of 56.2 (93rd percentile), mild atherosclerosis, normal coronary origin with right dominance, mildly dilated ascending aorta measuring 39 x 38 mm  Last cardiology clinic visit was 01/10/2022 with Dr. Reek.  He did not have any concerning cardiac symptoms.  He reported home SBP in the range of 120 to 130 mmHg.  His LDL cholesterol was 79 in August 2022.  He had been taking pravastatin  for years.  He was advised to stop pravastatin  and start rosuvastatin  20 mg daily.  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       History of Present Illness: [name] is a *** 52 y.o. male who is here today for annual follow-up of aortic valve disease.    Discussed the use of AI scribe software for clinical note transcription with the patient, who gave verbal consent to proceed.   ROS: ***       Studies Reviewed: .        *** Risk Assessment/Calculations:   {Does this patient  have ATRIAL FIBRILLATION?:[phone]} No BP recorded.  {Refresh Note OR Click here to enter BP  :1}***       Physical Exam:   VS:  There were no vitals taken for this visit.   Wt Readings from Last 3 Encounters:  07/05/23 205 lb (93 kg)  06/14/23 205 lb (93 kg)  05/11/23 205 lb (93 kg)    GEN: Well nourished, well developed in no acute distress NECK: No JVD; No carotid bruits CARDIAC: ***RRR, no murmurs, rubs, gallops RESPIRATORY:  Clear to auscultation without rales, wheezing or rhonchi  ABDOMEN: Soft, non-tender, non-distended EXTREMITIES:  No edema; No deformity     ASSESSMENT AND PLAN: .    Bicuspid aortic valve: Ascending aortic aneurysm: CTA 06/20/2023 revealed aortic dilatation 43 mm, unchanged from previous CT in 2023. Continue annual imaging.  Hypertension: Hyperlipidemia LDL goal < 70:     {Are you ordering a CV Procedure (e.g. stress test, cath, DCCV, TEE, etc)?   Press F2        :[id number]}  Dispo: ***  Signed, Rosaline Bane, NP-C

## 2023-07-08 NOTE — Telephone Encounter (Signed)
  Follow up Call-     07/05/2023   10:17 AM  Call back number  Post procedure Call Back phone  # [phone]  Permission to leave phone message Yes     Patient questions:  Do you have a fever, pain , or abdominal swelling? No. Pain Score  0 *  Have you tolerated food without any problems? Yes.    Have you been able to return to your normal activities? Yes.    Do you have any questions about your discharge instructions: Diet   No. Medications  No. Follow up visit  No.  Do you have questions or concerns about your Care? No.  Actions: * If pain score is 4 or above: No action needed, pain <4.

## 2023-07-13 ENCOUNTER — Other Ambulatory Visit: Payer: Self-pay | Admitting: Interventional Cardiology

## 2023-07-18 NOTE — Progress Notes (Signed)
 Cardiology Office Note:  .   Date:  07/22/2023  ID:  [name], DOB 11-30-1970, MRN [id number] PCP: [facility]  [facility] Providers Cardiologist:  Candyce Reek, MD    Patient Profile: .      PMH Bicuspid aortic valve Coronary artery calcification Mixed hyperlipidemia Hypertension Tobacco abuse GERD  He established with cardiology for known bicuspid aortic valve and has been followed by Dr. Reek.  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  He exercises regularly.  Smokes an occasional cigar.  Echocardiogram 10/20/2020 revealed LVEF 55 to 60%, normal diastolic parameters, normal RV, trivial MR, bicuspid aortic valve with mild calcification, trivial AI, mild dilatation of the ascending aorta measuring 43 mm, new from echo in 2014. He underwent coronary CTA 04/17/2021 which revealed coronary calcium  score of 56.2 (93rd percentile), mild atherosclerosis, normal coronary origin with right dominance, mildly dilated ascending aorta measuring 39 x 38 mm  Last cardiology clinic visit was 01/10/2022 with Dr. Reek.  He did not have any concerning cardiac symptoms.  He reported home SBP in the range of 120 to 130 mmHg.  His LDL cholesterol was 79 in August 2022.  He had been taking pravastatin  for years.  He was advised to stop pravastatin  and start rosuvastatin  20 mg daily.  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       History of Present Illness: [name] is a very pleasant 53 y.o. male who is here today for annual follow-up of aortic valve disease. 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 He reports no issues with endurance or shortness of breath during his physically demanding job, including during his recent job performance test. His performance was well below standard times for his age  group. History of diverticulitis. Recent colonoscopy was clear, but he has been advised to increase fiber in his diet. Has been eating more beans and trying to increase fruit consumption. He has a family history of high blood pressure and was diagnosed with it in his early twenties. He also has aortic dilatation, which was stable on CT 06/2023. He denies chest pain, shortness of breath, lower extremity edema, fatigue, palpitations, presyncope, syncope, orthopnea, and PND.   Discussed the use of AI scribe software for clinical note transcription with the patient, who gave verbal consent to proceed.   ROS: See HPI       Studies Reviewed: .        Risk Assessment/Calculations:             Physical Exam:   VS:  BP 132/88   Pulse 85   Ht 6' (1.829 m)   Wt 209 lb 3.2 oz (94.9 kg)   SpO2 97%   BMI 28.37 kg/m    Wt Readings from Last 3 Encounters:  07/22/23 209 lb 3.2 oz (94.9 kg)  07/05/23 205 lb (93 kg)  06/14/23 205 lb (93 kg)    GEN: Well nourished, well developed in no acute distress NECK: No JVD; No carotid bruits CARDIAC: RRR, no murmurs, rubs, gallops RESPIRATORY:  Clear to auscultation without rales, wheezing or rhonchi  ABDOMEN: Soft, non-tender, non-distended EXTREMITIES:  No edema; No deformity     ASSESSMENT AND PLAN: .    Bicuspid aortic valve: Bicuspid aortic valve identified on echo in 2014.  Most  recent echo 10/20/2020 revealed bicuspid aortic valve without significant stenosis, mild aortic dilatation which is stable on CT from 06/2023, normal LV function. He is asymptomatic. We discussed routine monitoring of aortic valve function on echo. We will continue to monitor clinically for now. Advised him of symptoms to report prior to next appointment. Consider repeat echo if he develops concerning symptoms.   Ascending aortic aneurysm: CTA 06/20/2023 revealed aortic dilatation 43 mm, unchanged from previous CT in 2023. Continue annual imaging. Recommended limited weight  lifting and maintain good BP control.   Hypertension: BP rechecked to ensure stability. He has not been monitoring on a routine basis but no concerning readings or symptoms of elevated BP to his awareness. Encouraged routine monitoring of BP and report concerns. No medication changes today. Renal function stable on labs completed 05/11/23.   Hyperlipidemia LDL goal < 70: Lipid panel completed 02/13/2023 revealed total cholesterol 168, HDL 74, LDL 78, triglycerides 89. Lengthy discussion about LDL goal < 70. Overall eats a healthy diet with lots of vegetables and fish but admits to regular soda consumption. Has been working to increase fiber by eating more fruit. Encouraged heart healthy mostly plant based diet avoiding saturated fat, processed foods, simple carbohydrates, and sugar along with aiming for at least 150 minutes of moderate intensity exercise each week. Emphasized replacing simple carbohydrates with whole grains.        Disposition: 1 year with Dr. Lavona (transfer from Dr. Dann)  Signed, Rosaline Bane, NP-C

## 2023-07-22 ENCOUNTER — Ambulatory Visit: Payer: 59 | Admitting: Nurse Practitioner

## 2023-07-22 ENCOUNTER — Ambulatory Visit: Payer: 59 | Attending: Nurse Practitioner | Admitting: Nurse Practitioner

## 2023-07-22 ENCOUNTER — Encounter: Payer: Self-pay | Admitting: Nurse Practitioner

## 2023-07-22 VITALS — BP 132/88 | HR 85 | Ht 72.0 in | Wt 209.2 lb

## 2023-07-22 DIAGNOSIS — I359 Nonrheumatic aortic valve disorder, unspecified: Secondary | ICD-10-CM

## 2023-07-22 DIAGNOSIS — I10 Essential (primary) hypertension: Secondary | ICD-10-CM | POA: Diagnosis not present

## 2023-07-22 DIAGNOSIS — E782 Mixed hyperlipidemia: Secondary | ICD-10-CM | POA: Diagnosis not present

## 2023-07-22 DIAGNOSIS — I7121 Aneurysm of the ascending aorta, without rupture: Secondary | ICD-10-CM | POA: Diagnosis not present

## 2023-07-22 NOTE — Patient Instructions (Addendum)
 Medication Instructions:   Your physician recommends that you continue on your current medications as directed. Please refer to the Current Medication list given to you today.   *If you need a refill on your cardiac medications before your next appointment, please call your pharmacy*   Lab Work:  None ordered.  If you have labs (blood work) drawn today and your tests are completely normal, you will receive your results only by: MyChart Message (if you have MyChart) OR A paper copy in the mail If you have any lab test that is abnormal or we need to change your treatment, we will call you to review the results.   Testing/Procedures:  None ordered.   Follow-Up: At [facility], you and your health needs are our priority.  As part of our continuing mission to provide you with exceptional heart care, we have created designated Provider Care Teams.  These Care Teams include your primary Cardiologist (physician) and Advanced Practice Providers (APPs -  Physician Assistants and Nurse Practitioners) who all work together to provide you with the care you need, when you need it.  We recommend signing up for the patient portal called MyChart.  Sign up information is provided on this After Visit Summary.  MyChart is used to connect with patients for Virtual Visits (Telemedicine).  Patients are able to view lab/test results, encounter notes, upcoming appointments, etc.  Non-urgent messages can be sent to your provider as well.   To learn more about what you can do with MyChart, go to [email or web address].    Your next appointment:   1 year(s)  Provider:   Ethan Schilling, MD     Other Instructions  Your physician wants you to follow-up in: 1 year.  You will receive a reminder letter in the mail two months in advance. If you don't receive a letter, please call our office to schedule the follow-up appointment.    1st Floor: - Lobby - Registration  - Pharmacy  - Lab -  Cafe  2nd Floor: - PV Lab - Diagnostic Testing (echo, CT, nuclear med)  3rd Floor: - Vacant  4th Floor: - TCTS (cardiothoracic surgery) - AFib Clinic - Structural Heart Clinic - Vascular Surgery  - Vascular Ultrasound  5th Floor: - HeartCare Cardiology (general and EP) - Clinical Pharmacy for coumadin, hypertension, lipid, weight-loss medications, and med management appointments    Valet parking services will be available as well.        Mediterranean Diet  Why follow it? Research shows. Those who follow the Mediterranean diet have a reduced risk of heart disease  The diet is associated with a reduced incidence of Parkinson's and Alzheimer's diseases People following the diet may have longer life expectancies and lower rates of chronic diseases  The Dietary Guidelines for Americans recommends the Mediterranean diet as an eating plan to promote health and prevent disease  What Is the Mediterranean Diet?  Healthy eating plan based on typical foods and recipes of Mediterranean-style cooking The diet is primarily a plant based diet; these foods should make up a majority of meals   Starches - Plant based foods should make up a majority of meals - They are an important sources of vitamins, minerals, energy, antioxidants, and fiber - Choose whole grains, foods high in fiber and minimally processed items  - Typical grain sources include wheat, oats, barley, corn, brown rice, bulgar, farro, millet, polenta, couscous  - Various types of beans include chickpeas, lentils, fava beans, black beans, white beans  Fruits  Veggies - Large quantities of antioxidant rich fruits & veggies; 6 or more servings  - Vegetables can be eaten raw or lightly drizzled with oil and cooked  - Vegetables common to the traditional Mediterranean Diet include: artichokes, arugula, beets, broccoli, brussel sprouts, cabbage, carrots, celery, collard greens, cucumbers, eggplant, kale, leeks, lemons, lettuce,  mushrooms, okra, onions, peas, peppers, potatoes, pumpkin, radishes, rutabaga, shallots, spinach, sweet potatoes, turnips, zucchini - Fruits common to the Mediterranean Diet include: apples, apricots, avocados, cherries, clementines, dates, figs, grapefruits, grapes, melons, nectarines, oranges, peaches, pears, pomegranates, strawberries, tangerines  Fats - Replace butter and margarine with healthy oils, such as olive oil, canola oil, and tahini  - Limit nuts to no more than a handful a day  - Nuts include walnuts, almonds, pecans, pistachios, pine nuts  - Limit or avoid candied, honey roasted or heavily salted nuts - Olives are central to the [employer] - can be eaten whole or used in a variety of dishes   Meats Protein - Limiting red meat: no more than a few times a month - When eating red meat: choose lean cuts and keep the portion to the size of deck of cards - Eggs: approx. 0 to 4 times a week  - Fish and lean poultry: at least 2 a week  - Healthy protein sources include, chicken, turkey, lean beef, lamb - Increase intake of seafood such as tuna, salmon, trout, mackerel, shrimp, scallops - Avoid or limit high fat processed meats such as sausage and bacon  Dairy - Include moderate amounts of low fat dairy products  - Focus on healthy dairy such as fat free yogurt, skim milk, low or reduced fat cheese - Limit dairy products higher in fat such as whole or 2% milk, cheese, ice cream  Alcohol - Moderate amounts of red wine is ok  - No more than 5 oz daily for women (all ages) and men older than age 20  - No more than 10 oz of wine daily for men younger than 65  Other - Limit sweets and other desserts  - Use herbs and spices instead of salt to flavor foods  - Herbs and spices common to the traditional Mediterranean Diet include: basil, bay leaves, chives, cloves, cumin, fennel, garlic, lavender, marjoram, mint, oregano, parsley, pepper, rosemary, sage, savory, sumac, tarragon, thyme    It's not just a diet, it's a lifestyle:  The Mediterranean diet includes lifestyle factors typical of those in the region  Foods, drinks and meals are best eaten with others and savored Daily physical activity is important for overall good health This could be strenuous exercise like running and aerobics This could also be more leisurely activities such as walking, housework, yard-work, or taking the stairs Moderation is the key; a balanced and healthy diet accommodates most foods and drinks Consider portion sizes and frequency of consumption of certain foods   Meal Ideas & Options:  Breakfast:  Whole wheat toast or whole wheat English muffins with peanut butter & hard boiled egg Steel cut oats topped with apples & cinnamon and skim milk  Fresh fruit: banana, strawberries, melon, berries, peaches  Smoothies: strawberries, bananas, greek yogurt, peanut butter Low fat greek yogurt with blueberries and granola  Egg white omelet with spinach and mushrooms Breakfast couscous: whole wheat couscous, apricots, skim milk, cranberries  Sandwiches:  Hummus and grilled vegetables (peppers, zucchini, squash) on whole wheat bread   Grilled chicken on whole wheat pita with lettuce, tomatoes, cucumbers or tzatziki  Tuna salad on whole wheat bread: tuna salad made with greek yogurt, olives, red peppers, capers, green onions Garlic rosemary lamb pita: lamb sauted with garlic, rosemary, salt & pepper; add lettuce, cucumber, greek yogurt to pita - flavor with lemon juice and black pepper  Seafood:  Mediterranean grilled salmon, seasoned with garlic, basil, parsley, lemon juice and black pepper Shrimp, lemon, and spinach whole-grain pasta salad made with low fat greek yogurt  Seared scallops with lemon orzo  Seared tuna steaks seasoned salt, pepper, coriander topped with tomato mixture of olives, tomatoes, olive oil, minced garlic, parsley, green onions and cappers  Meats:  Herbed greek chicken salad  with kalamata olives, cucumber, feta  Red bell peppers stuffed with spinach, bulgur, lean ground beef (or lentils) & topped with feta   Kebabs: skewers of chicken, tomatoes, onions, zucchini, squash  Turkey burgers: made with red onions, mint, dill, lemon juice, feta cheese topped with roasted red peppers Vegetarian Cucumber salad: cucumbers, artichoke hearts, celery, red onion, feta cheese, tossed in olive oil & lemon juice  Hummus and whole grain pita points with a greek salad (lettuce, tomato, feta, olives, cucumbers, red onion) Lentil soup with celery, carrots made with vegetable broth, garlic, salt and pepper  Tabouli salad: parsley, bulgur, mint, scallions, cucumbers, tomato, radishes, lemon juice, olive oil, salt and pepper.

## 2023-08-06 ENCOUNTER — Ambulatory Visit: Payer: 59 | Admitting: Gastroenterology

## 2023-08-16 ENCOUNTER — Ambulatory Visit: Payer: 59 | Admitting: Gastroenterology

## 2023-08-20 ENCOUNTER — Other Ambulatory Visit: Payer: Self-pay | Admitting: Interventional Cardiology

## 2023-08-27 ENCOUNTER — Ambulatory Visit: Payer: Self-pay | Admitting: Physician Assistant

## 2023-08-27 ENCOUNTER — Encounter: Payer: Self-pay | Admitting: Physician Assistant

## 2023-08-27 VITALS — BP 150/96 | HR 89 | Resp 16 | Ht 72.0 in | Wt 205.0 lb

## 2023-08-27 DIAGNOSIS — J189 Pneumonia, unspecified organism: Secondary | ICD-10-CM

## 2023-08-27 MED ORDER — BENZONATATE 200 MG PO CAPS: 200.0000 mg | ORAL_CAPSULE | Freq: Three times a day (TID) | ORAL | 0 refills | Status: DC | PRN

## 2023-08-27 MED ORDER — AZITHROMYCIN 250 MG PO TABS: ORAL_TABLET | ORAL | 0 refills | Status: AC

## 2023-08-27 MED ORDER — HYDROCOD POLI-CHLORPHE POLI ER 10-8 MG/5ML PO SUER: 5.0000 mL | Freq: Every evening | ORAL | 0 refills | Status: DC | PRN

## 2023-08-27 NOTE — Progress Notes (Signed)
Pt presents today with cough x one month. Pt claims he had the flu about two weeks ago. Cough, body aches, fever etc. As of now pt has cough and drainage and worse at night.

## 2023-08-27 NOTE — Progress Notes (Signed)
   Subjective: Nonproductive cough    Patient ID: [name], male    DOB: 08/19/1970, 53 y.o.   MRN: [id number]  HPI Patient stated nonproductive cough 2 weeks.  Patient said he had flulike symptoms 1 week ago.  Patient states father was diagnosed with pneumonia last week.  Denies fever chills associated complaint at this time.  No recent travel or known contact with COVID-19   Review of Systems Aortic valve disease and hypertension.    Objective:   Physical Exam  BP 150/96BP. 150/96. Data is abnormal. Has comment. Taken on 08/27/23 8:35 AM  Cuff Size Large  Pulse Rate 89  Weight 205 lb (93 kg)  Height 6' (1.829 m)  Resp 16  SpO2 98 %  No acute distress. HEENT unremarkable. Neck is supple for lymphadenopathy or bruits. Lungs with left upper rhonchi breath sounds. Heart regular rate and rhythm.      Assessment & Plan: Pneumonia  Patient given a prescription for Z-Pak, Tessalon Perles, and Tussionex to take at night.  Patient will follow-up in 6 days if no improvement.  Will consider x-ray.

## 2023-09-04 ENCOUNTER — Other Ambulatory Visit: Payer: Self-pay

## 2023-09-04 DIAGNOSIS — Z0283 Encounter for blood-alcohol and blood-drug test: Secondary | ICD-10-CM

## 2023-09-04 NOTE — Progress Notes (Signed)
 Pt completed RANDOM UDS & ETOH.  ETOH IS CLEARED. UDS RESULTS ARE PENDING.

## 2023-09-23 ENCOUNTER — Other Ambulatory Visit: Payer: Self-pay | Admitting: Interventional Cardiology

## 2023-11-01 ENCOUNTER — Ambulatory Visit: Payer: 59 | Admitting: Gastroenterology

## 2023-11-18 ENCOUNTER — Observation Stay (HOSPITAL_COMMUNITY)
Admission: EM | Admit: 2023-11-18 | Discharge: 2023-11-20 | Disposition: A | Discharge status: Home / Self Care | Attending: Internal Medicine | Admitting: Internal Medicine

## 2023-11-18 ENCOUNTER — Encounter: Payer: Self-pay | Admitting: Physician Assistant

## 2023-11-18 ENCOUNTER — Ambulatory Visit: Payer: Self-pay | Admitting: Physician Assistant

## 2023-11-18 ENCOUNTER — Encounter (HOSPITAL_COMMUNITY): Payer: Self-pay

## 2023-11-18 ENCOUNTER — Other Ambulatory Visit: Payer: Self-pay

## 2023-11-18 VITALS — BP 134/86 | HR 100 | Temp 97.1°F | Resp 16 | Wt 189.0 lb

## 2023-11-18 DIAGNOSIS — E871 Hypo-osmolality and hyponatremia: Secondary | ICD-10-CM | POA: Diagnosis not present

## 2023-11-18 DIAGNOSIS — E119 Type 2 diabetes mellitus without complications: Secondary | ICD-10-CM

## 2023-11-18 DIAGNOSIS — K219 Gastro-esophageal reflux disease without esophagitis: Secondary | ICD-10-CM | POA: Diagnosis not present

## 2023-11-18 DIAGNOSIS — F17290 Nicotine dependence, other tobacco product, uncomplicated: Secondary | ICD-10-CM | POA: Diagnosis not present

## 2023-11-18 DIAGNOSIS — E1110 Type 2 diabetes mellitus with ketoacidosis without coma: Secondary | ICD-10-CM | POA: Diagnosis not present

## 2023-11-18 DIAGNOSIS — Q2381 Bicuspid aortic valve: Secondary | ICD-10-CM | POA: Diagnosis not present

## 2023-11-18 DIAGNOSIS — E785 Hyperlipidemia, unspecified: Secondary | ICD-10-CM | POA: Diagnosis not present

## 2023-11-18 DIAGNOSIS — Z87891 Personal history of nicotine dependence: Secondary | ICD-10-CM

## 2023-11-18 DIAGNOSIS — R739 Hyperglycemia, unspecified: Principal | ICD-10-CM

## 2023-11-18 DIAGNOSIS — R634 Abnormal weight loss: Secondary | ICD-10-CM

## 2023-11-18 DIAGNOSIS — I10 Essential (primary) hypertension: Secondary | ICD-10-CM | POA: Insufficient documentation

## 2023-11-18 DIAGNOSIS — F1010 Alcohol abuse, uncomplicated: Secondary | ICD-10-CM | POA: Diagnosis not present

## 2023-11-18 DIAGNOSIS — Z79899 Other long term (current) drug therapy: Secondary | ICD-10-CM | POA: Insufficient documentation

## 2023-11-18 DIAGNOSIS — E1165 Type 2 diabetes mellitus with hyperglycemia: Secondary | ICD-10-CM | POA: Diagnosis not present

## 2023-11-18 DIAGNOSIS — E0801 Diabetes mellitus due to underlying condition with hyperosmolarity with coma: Secondary | ICD-10-CM

## 2023-11-18 DIAGNOSIS — R350 Frequency of micturition: Secondary | ICD-10-CM

## 2023-11-18 DIAGNOSIS — R631 Polydipsia: Secondary | ICD-10-CM

## 2023-11-18 DIAGNOSIS — E861 Hypovolemia: Secondary | ICD-10-CM | POA: Diagnosis not present

## 2023-11-18 LAB — COMPREHENSIVE METABOLIC PANEL WITH GFR
ALT: 28 U/L (ref 0–44)
AST: 24 U/L (ref 15–41)
Albumin: 4.6 g/dL (ref 3.5–5.0)
Alkaline Phosphatase: 84 U/L (ref 38–126)
Anion gap: 18 — ABNORMAL HIGH (ref 5–15)
BUN: 16 mg/dL (ref 6–20)
CO2: 21 mmol/L — ABNORMAL LOW (ref 22–32)
Calcium: 10 mg/dL (ref 8.9–10.3)
Chloride: 88 mmol/L — ABNORMAL LOW (ref 98–111)
Creatinine, Ser: 1.08 mg/dL (ref 0.61–1.24)
GFR, Estimated: >60 mL/min (ref >60)
Glucose, Bld: 385 mg/dL — ABNORMAL HIGH (ref 70–99)
Potassium: 4.5 mmol/L (ref 3.5–5.1)
Sodium: 127 mmol/L — ABNORMAL LOW (ref 135–145)
Total Bilirubin: 1.3 mg/dL — ABNORMAL HIGH (ref 0.0–1.2)
Total Protein: 8.6 g/dL — ABNORMAL HIGH (ref 6.5–8.1)

## 2023-11-18 LAB — URINALYSIS, ROUTINE W REFLEX MICROSCOPIC
Bacteria, UA: NONE SEEN
Bilirubin Urine: NEGATIVE
Glucose, UA: >=500 mg/dL — AB
Hgb urine dipstick: NEGATIVE
Ketones, ur: 80 mg/dL — AB
Leukocytes,Ua: NEGATIVE
Nitrite: NEGATIVE
Protein, ur: NEGATIVE mg/dL
Specific Gravity, Urine: 1.029 (ref 1.005–1.030)
pH: 6 (ref 5.0–8.0)

## 2023-11-18 LAB — POCT URINALYSIS DIPSTICK
Bilirubin, UA: NEGATIVE
Blood, UA: NEGATIVE
Glucose, UA: POSITIVE — AB
Ketones, UA: POSITIVE — AB
Leukocytes, UA: NEGATIVE
Nitrite, UA: NEGATIVE
Protein, UA: NEGATIVE
Spec Grav, UA: 1.01 (ref 1.010–1.025)
Urobilinogen, UA: 0.2 U/dL
pH, UA: 6 (ref 5.0–8.0)

## 2023-11-18 LAB — POCT GLYCOSYLATED HEMOGLOBIN (HGB A1C): HbA1c POC (<> result, manual entry): 9.4 % (ref 4.0–5.6)

## 2023-11-18 LAB — BLOOD GAS, VENOUS
Acid-Base Excess: 0.5 mmol/L (ref 0.0–2.0)
Bicarbonate: 24.6 mmol/L (ref 20.0–28.0)
Drawn by: 7012
O2 Saturation: 86.3 %
Patient temperature: 37.2
pCO2, Ven: 37 mmHg — ABNORMAL LOW (ref 44–60)
pH, Ven: 7.43 (ref 7.25–7.43)
pO2, Ven: 54 mmHg — ABNORMAL HIGH (ref 32–45)

## 2023-11-18 LAB — CBC WITH DIFFERENTIAL/PLATELET
Abs Immature Granulocytes: 0.01 10*3/uL (ref 0.00–0.07)
Basophils Absolute: 0.1 10*3/uL (ref 0.0–0.1)
Basophils Relative: 1 %
Eosinophils Absolute: 0.3 10*3/uL (ref 0.0–0.5)
Eosinophils Relative: 3 %
HCT: 43 % (ref 39.0–52.0)
Hemoglobin: 15 g/dL (ref 13.0–17.0)
Immature Granulocytes: 0 %
Lymphocytes Relative: 33 %
Lymphs Abs: 2.6 10*3/uL (ref 0.7–4.0)
MCH: 32.1 pg (ref 26.0–34.0)
MCHC: 34.9 g/dL (ref 30.0–36.0)
MCV: 91.9 fL (ref 80.0–100.0)
Monocytes Absolute: 0.4 10*3/uL (ref 0.1–1.0)
Monocytes Relative: 5 %
Neutro Abs: 4.5 10*3/uL (ref 1.7–7.7)
Neutrophils Relative %: 58 %
Platelets: 280 10*3/uL (ref 150–400)
RBC: 4.68 MIL/uL (ref 4.22–5.81)
RDW: 11.5 % (ref 11.5–15.5)
WBC: 7.9 10*3/uL (ref 4.0–10.5)
nRBC: 0 % (ref 0.0–0.2)

## 2023-11-18 LAB — BASIC METABOLIC PANEL WITH GFR
Anion gap: 12 (ref 5–15)
BUN: 16 mg/dL (ref 6–20)
CO2: 23 mmol/L (ref 22–32)
Calcium: 9.3 mg/dL (ref 8.9–10.3)
Chloride: 93 mmol/L — ABNORMAL LOW (ref 98–111)
Creatinine, Ser: 0.88 mg/dL (ref 0.61–1.24)
GFR, Estimated: >60 mL/min (ref >60)
Glucose, Bld: 286 mg/dL — ABNORMAL HIGH (ref 70–99)
Potassium: 4.5 mmol/L (ref 3.5–5.1)
Sodium: 128 mmol/L — ABNORMAL LOW (ref 135–145)

## 2023-11-18 LAB — CBG MONITORING, ED
Glucose-Capillary: 373 mg/dL — ABNORMAL HIGH (ref 70–99)
Glucose-Capillary: 342 mg/dL — ABNORMAL HIGH (ref 70–99)
Glucose-Capillary: 297 mg/dL — ABNORMAL HIGH (ref 70–99)
Glucose-Capillary: 259 mg/dL — ABNORMAL HIGH (ref 70–99)
Glucose-Capillary: 249 mg/dL — ABNORMAL HIGH (ref 70–99)

## 2023-11-18 LAB — BETA-HYDROXYBUTYRIC ACID
Beta-Hydroxybutyric Acid: 3.59 mmol/L — ABNORMAL HIGH (ref 0.05–0.27)
Beta-Hydroxybutyric Acid: 3.11 mmol/L — ABNORMAL HIGH (ref 0.05–0.27)

## 2023-11-18 LAB — ETHANOL: Alcohol, Ethyl (B): <15 mg/dL (ref <15)

## 2023-11-18 LAB — GLUCOSE, CAPILLARY: Glucose-Capillary: 192 mg/dL — ABNORMAL HIGH (ref 70–99)

## 2023-11-18 LAB — GLUCOSE, POCT (MANUAL RESULT ENTRY): POC Glucose: 446 mg/dL — AB (ref 70–99)

## 2023-11-18 MED ORDER — DEXTROSE IN LACTATED RINGERS 5 % IV SOLN: INTRAVENOUS | Status: AC

## 2023-11-18 MED ORDER — INSULIN REGULAR(HUMAN) IN NACL 100-0.9 UT/100ML-% IV SOLN
INTRAVENOUS | Status: DC
  Administered 2023-11-18: 12 [IU]/h via INTRAVENOUS
  Filled 2023-11-18: qty 100

## 2023-11-18 MED ORDER — LACTATED RINGERS IV SOLN
INTRAVENOUS | Status: AC
  Administered 2023-11-18: 1000 mL via INTRAVENOUS

## 2023-11-18 MED ORDER — INSULIN ASPART 100 UNIT/ML IJ SOLN: 0.0000 [IU] | Freq: Three times a day (TID) | INTRAMUSCULAR | Status: DC

## 2023-11-18 MED ORDER — ENOXAPARIN SODIUM 40 MG/0.4ML IJ SOSY
40.0000 mg | PREFILLED_SYRINGE | INTRAMUSCULAR | Status: DC
  Administered 2023-11-19 (×2): 40 mg via SUBCUTANEOUS
  Filled 2023-11-18 (×2): qty 0.4

## 2023-11-18 MED ORDER — CHLORHEXIDINE GLUCONATE CLOTH 2 % EX PADS
6.0000 | MEDICATED_PAD | Freq: Every day | CUTANEOUS | Status: DC
  Administered 2023-11-19 – 2023-11-20 (×2): 6 via TOPICAL

## 2023-11-18 MED ORDER — DEXTROSE 50 % IV SOLN: 0.0000 mL | INTRAVENOUS | Status: DC | PRN

## 2023-11-18 MED ORDER — POTASSIUM CHLORIDE 10 MEQ/100ML IV SOLN: 10.0000 meq | INTRAVENOUS | Status: AC

## 2023-11-18 MED ORDER — ROSUVASTATIN CALCIUM 20 MG PO TABS
20.0000 mg | ORAL_TABLET | Freq: Every day | ORAL | Status: DC
  Administered 2023-11-19 – 2023-11-20 (×2): 20 mg via ORAL
  Filled 2023-11-18 (×2): qty 1

## 2023-11-18 MED ORDER — LACTATED RINGERS IV BOLUS
1000.0000 mL | Freq: Once | INTRAVENOUS | Status: AC
  Administered 2023-11-18: 1000 mL via INTRAVENOUS

## 2023-11-18 MED ORDER — SODIUM CHLORIDE 0.9% FLUSH
3.0000 mL | Freq: Two times a day (BID) | INTRAVENOUS | Status: DC
  Administered 2023-11-19 – 2023-11-20 (×3): 3 mL via INTRAVENOUS
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
## 2023-11-18 NOTE — Progress Notes (Signed)
   Subjective: Fatigue and weight loss    Patient ID: [name], male    DOB: Nov 30, 1970, 53 y.o.   MRN: [id number]  HPI Patient is a 53 year old male has a history of prediabetes.  Patient stated the past 2 weeks  has noted increased fatigue, thirst, urinary frequency, and weakness.  Complaining of decreased appetite and a 12 pound weight loss.  Denies a family history of diabetes involving mother and father.   Review of Systems Hyperlipidemia and hypertension    Objective:   Physical Exam Deferred BP 134/86  Pulse Rate 100  Temp 97.1 F (36.2 C)Temp. 97.1 F (36.2 C). Data is abnormal. Taken on 11/18/23 2:55 PM  Weight 189 lb (85.7 kg)  Resp 16  SpO2 98 %          Component Ref Range & Units (hover) 14:41 9 mo ago 1 yr ago 2 yr ago 10 yr ago  Hemoglobin A1C  5.9 High  R, CM 5.9 High  R, CM 5.5 R, CM 5.3 R, CM  HbA1c POC (<> result, manual entry) 9.4      HbA1c, POC (prediabetic range)       HbA1c, POC (controlled diabetic range)                    Component Ref Range & Units (hover) 14:40  POC Glucose 446 Abnormal                  Assessment & Plan: New onset diabetes  Patient advised to go to emergency room with shortness for definitive evaluation and treatment.  Patient given a work note for today and tomorrow.  Patient will follow-up with this clinic status post evaluation and treatment at the emergency room.

## 2023-11-18 NOTE — ED Triage Notes (Signed)
 Pt arrived via POV from PCP Office for evaluation of new onset hyperglycemia. Pt denies Hx of Diabetes. Pt reports he began noticing increased thirst and frequent urination since last week. Pt reports his legs have been feeling heavier as well.

## 2023-11-18 NOTE — ED Provider Notes (Signed)
 [place] EMERGENCY DEPARTMENT AT [facility] Provider Note   CSN: [id number] Arrival date & time: 11/18/23  1546     History  Chief Complaint  Patient presents with   Hyperglycemia    [name] is a 53 y.o. male with PMH as listed below who presents with new onset diabetes.  Seen in from occupational health.  He states for 2 weeks he is noticed increased thirst and frequency of urination.  Sweet taste in his mouth and approximately 10 pound weight loss over the last several weeks.  States that blood work was done at the occupational health office this morning and A1c was greater than 9.  He currently denies any chest pain, shortness of breath abdominal pain nausea or vomiting.  No visual changes or dizziness.  Significant family history of T2DM as well as T1DM in his grandmother. No recent infectious sxs.     Past Medical History:  Diagnosis Date   Bicuspid aortic valve    C. difficile colitis 09/2010   following 2 rounds of ABX- cipro  and augmentin    Diverticulitis    GERD (gastroesophageal reflux disease)    Heart murmur    Hyperlipidemia    Hypertension        Home Medications Prior to Admission medications   Medication Sig Start Date End Date Taking? Authorizing Provider  lisinopril -hydrochlorothiazide  (ZESTORETIC ) 10-12.5 MG tablet Take 1 tablet by mouth daily. 08/20/23  Yes Eilleen Grates, MD  rosuvastatin  (CRESTOR ) 20 MG tablet TAKE 1 TABLET BY MOUTH EVERY DAY 09/24/23  Yes Swinyer, Leilani Punter, NP      Allergies    Patient has no known allergies.    Review of Systems   Review of Systems A 10 point review of systems was performed and is negative unless otherwise reported in HPI.  Physical Exam Updated Vital Signs BP (!) 141/97   Pulse 87   Temp 98 F (36.7 C)   Resp 17   Ht 6' (1.829 m)   Wt 85.7 kg   SpO2 98%   BMI 25.63 kg/m  Physical Exam General: Normal appearing male, lying in bed.  HEENT: PERRLA, Sclera anicteric, MMM, trachea  midline.  Cardiology: RRR, Resp: Normal respiratory rate and effort. Abd: Soft, non-tender, non-distended. No rebound tenderness or guarding.  GU: Deferred. MSK: No peripheral edema or signs of trauma. Extremities without deformity or TTP. No cyanosis or clubbing. Skin: warm, dry.  Back: No CVA tenderness Neuro: A&Ox4, CNs II-XII grossly intact. MAEs. Sensation grossly intact.  Psych: Normal mood and affect.   ED Results / Procedures / Treatments   Labs (all labs ordered are listed, but only abnormal results are displayed) Labs Reviewed  BETA-HYDROXYBUTYRIC ACID - Abnormal; Notable for the following components:      Result Value   Beta-Hydroxybutyric Acid 3.59 (*)    All other components within normal limits  COMPREHENSIVE METABOLIC PANEL WITH GFR - Abnormal; Notable for the following components:   Sodium 127 (*)    Chloride 88 (*)    CO2 21 (*)    Glucose, Bld 385 (*)    Total Protein 8.6 (*)    Total Bilirubin 1.3 (*)    Anion gap 18 (*)    All other components within normal limits  URINALYSIS, ROUTINE W REFLEX MICROSCOPIC - Abnormal; Notable for the following components:   Color, Urine STRAW (*)    Glucose, UA >=500 (*)    Ketones, ur 80 (*)    All other components within  normal limits  BLOOD GAS, VENOUS - Abnormal; Notable for the following components:   pCO2, Ven 37 (*)    pO2, Ven 54 (*)    All other components within normal limits  CBG MONITORING, ED - Abnormal; Notable for the following components:   Glucose-Capillary 373 (*)    All other components within normal limits  CBG MONITORING, ED - Abnormal; Notable for the following components:   Glucose-Capillary 342 (*)    All other components within normal limits  CBG MONITORING, ED - Abnormal; Notable for the following components:   Glucose-Capillary 297 (*)    All other components within normal limits  CBC WITH DIFFERENTIAL/PLATELET  HEMOGLOBIN A1C  HIV ANTIBODY (ROUTINE TESTING W REFLEX)  CBC  MAGNESIUM   PHOSPHORUS  BASIC METABOLIC PANEL WITH GFR  BASIC METABOLIC PANEL WITH GFR  BASIC METABOLIC PANEL WITH GFR  OSMOLALITY  BASIC METABOLIC PANEL WITH GFR    EKG None  Radiology No results found.  Procedures Procedures    Medications Ordered in ED Medications  insulin regular, human (MYXREDLIN) 100 units/ 100 mL infusion (0 Units/hr Intravenous Hold 11/18/23 2011)  lactated ringers infusion (has no administration in time range)  dextrose  5 % in lactated ringers infusion (has no administration in time range)  dextrose  50 % solution 0-50 mL (has no administration in time range)  lactated ringers bolus 1,000 mL (has no administration in time range)  potassium chloride  10 mEq in 100 mL IVPB (0 mEq Intravenous Hold 11/18/23 2011)  enoxaparin (LOVENOX) injection 40 mg (has no administration in time range)  sodium chloride  flush (NS) 0.9 % injection 3 mL (has no administration in time range)  rosuvastatin  (CRESTOR ) tablet 20 mg (has no administration in time range)  lactated ringers bolus 1,000 mL (1,000 mLs Intravenous Bolus 11/18/23 1852)    ED Course/ Medical Decision Making/ A&P                          Medical Decision Making Amount and/or Complexity of Data Reviewed Labs: ordered. Decision-making details documented in ED Course.  Risk Decision regarding hospitalization.    This patient presents to the ED for concern of elevated A1c, this involves an extensive number of treatment options, and is a complaint that carries with it a high risk of complications and morbidity.  I considered the following differential and admission for this acute, potentially life threatening condition. Overall patient is very well-appearing, HDS.  MDM:    Pt with increased thirst/urination found to have hyperglycemia and elevated A1c. Likely new T2DM. He does have ketonuria w/ elevated AG and elevated beta hydroxybutyrate with no acidosis. Possible he is having early DKA. Given fluids, SQ insulin,  will admit to medicine.  Clinical Course as of 11/18/23 2019  Mon Nov 18, 2023  1847 Ketones, ur(!): 80 [HN]  1848 CO2(!): 21 [HN]  1848 Anion gap(!): 18 [HN]  1848 pH, Ven: 7.43 No acidosis [HN]  1848 Beta-Hydroxybutyric Acid(!): 3.59 [HN]  1848 Potassium: 4.5 [HN]  1850 Ordered fluids and insulin, consult to medicine [HN]    Clinical Course User Index [HN] Merdis Stalling, MD    Labs: I Ordered, and personally interpreted labs.  The pertinent results include:  those listed above  Additional history obtained from chart review.    Cardiac Monitoring: The patient was maintained on a cardiac monitor.  I personally viewed and interpreted the cardiac monitored which showed an underlying rhythm of: NSR  Reevaluation: After  the interventions noted above, I reevaluated the patient and found that they have :improved  Social Determinants of Health: Lives independently  Disposition:  Admit to hospitalist  Co morbidities that complicate the patient evaluation  Past Medical History:  Diagnosis Date   Bicuspid aortic valve    C. difficile colitis 09/2010   following 2 rounds of ABX- cipro  and augmentin    Diverticulitis    GERD (gastroesophageal reflux disease)    Heart murmur    Hyperlipidemia    Hypertension      Medicines Meds ordered this encounter  Medications   lactated ringers bolus 1,000 mL   DISCONTD: insulin aspart (novoLOG) injection 0-9 Units    Correction coverage::   Sensitive (thin, NPO, renal)    CBG < 70::   Implement Hypoglycemia Standing Orders and refer to Hypoglycemia Standing Orders sidebar report    CBG 70 - 120::   0 units    CBG 121 - 150::   1 unit    CBG 151 - 200::   2 units    CBG 201 - 250::   3 units    CBG 251 - 300::   5 units    CBG 301 - 350::   7 units    CBG 351 - 400:   9 units    CBG > 400:   call MD and obtain STAT lab verification   insulin regular, human (MYXREDLIN) 100 units/ 100 mL infusion    EndoTool Goal Range::   140-180     Type of Diabetes:   Type 2    Mode of Therapy:   ENDOX1 for DKA    Start Method:   EndoTool to calculate   lactated ringers infusion   dextrose  5 % in lactated ringers infusion   dextrose  50 % solution 0-50 mL   lactated ringers bolus 1,000 mL   potassium chloride  10 mEq in 100 mL IVPB   enoxaparin (LOVENOX) injection 40 mg   sodium chloride  flush (NS) 0.9 % injection 3 mL   rosuvastatin  (CRESTOR ) tablet 20 mg    I have reviewed the patients home medicines and have made adjustments as needed  Problem List / ED Course: Problem List Items Addressed This Visit   None Visit Diagnoses       Hyperglycemia    -  Primary                   This note was created using dictation software, which may contain spelling or grammatical errors.    Merdis Stalling, MD 11/18/23 2020

## 2023-11-18 NOTE — Progress Notes (Signed)
 Pt presents today stating he doesn't feel well/dry mouth/lost 12 lbs/frequent urination/excessive thirst x1 week plus/physical due in August/poor energy

## 2023-11-18 NOTE — ED Provider Triage Note (Signed)
 Emergency Medicine Provider Triage Evaluation Note  [name] , a 53 y.o. male  was evaluated in triage.  Here for evaluation of new onset diabetes.  Seen in from occupational health.  He states for 2 weeks he is noticed increased thirst and frequency of urination.  Foul taste in his mouth and approximately 10 pound weight loss over the last several weeks.  States that blood work was done at the occupational health office this morning and A1c was greater than 9.  He currently denies any chest pain, shortness of breath abdominal pain nausea or vomiting.  No visual changes or dizziness.  Significant family history of diabetes.   Review of Systems  Positive: Increased thirst, increased frequency of urination, weight loss Negative: Chest pain, dizziness, headache, nausea or vomiting  Physical Exam  BP (!) 145/109 (BP Location: Right Arm)   Pulse (!) 106   Temp 98 F (36.7 C)   Resp 17   Ht 6' (1.829 m)   Wt 85.7 kg   SpO2 100%   BMI 25.63 kg/m  Gen:   Awake, no distress   Resp:  Normal effort  MSK:   Moves extremities without difficulty  Other:    Medical Decision Making  Medically screening exam initiated at 4:29 PM.  Appropriate orders placed.  [name] was informed that the remainder of the evaluation will be completed by another provider, this initial triage assessment does not replace that evaluation, and the importance of remaining in the ED until their evaluation is complete.     Kyle Clubs, PA-C 11/18/23 1631

## 2023-11-19 ENCOUNTER — Telehealth (HOSPITAL_COMMUNITY): Payer: Self-pay | Admitting: Pharmacy Technician

## 2023-11-19 ENCOUNTER — Other Ambulatory Visit (HOSPITAL_COMMUNITY): Payer: Self-pay

## 2023-11-19 DIAGNOSIS — I10 Essential (primary) hypertension: Secondary | ICD-10-CM

## 2023-11-19 DIAGNOSIS — Z87891 Personal history of nicotine dependence: Secondary | ICD-10-CM

## 2023-11-19 DIAGNOSIS — Q2381 Bicuspid aortic valve: Secondary | ICD-10-CM | POA: Diagnosis not present

## 2023-11-19 DIAGNOSIS — E785 Hyperlipidemia, unspecified: Secondary | ICD-10-CM

## 2023-11-19 DIAGNOSIS — E1110 Type 2 diabetes mellitus with ketoacidosis without coma: Secondary | ICD-10-CM | POA: Diagnosis not present

## 2023-11-19 DIAGNOSIS — F1010 Alcohol abuse, uncomplicated: Secondary | ICD-10-CM | POA: Diagnosis not present

## 2023-11-19 DIAGNOSIS — E871 Hypo-osmolality and hyponatremia: Secondary | ICD-10-CM

## 2023-11-19 DIAGNOSIS — E119 Type 2 diabetes mellitus without complications: Secondary | ICD-10-CM | POA: Diagnosis not present

## 2023-11-19 LAB — BASIC METABOLIC PANEL WITH GFR
Anion gap: 7 (ref 5–15)
Anion gap: 10 (ref 5–15)
Anion gap: 7 (ref 5–15)
Anion gap: 11 (ref 5–15)
BUN: 13 mg/dL (ref 6–20)
BUN: 10 mg/dL (ref 6–20)
BUN: 8 mg/dL (ref 6–20)
BUN: 8 mg/dL (ref 6–20)
CO2: 26 mmol/L (ref 22–32)
CO2: 24 mmol/L (ref 22–32)
CO2: 23 mmol/L (ref 22–32)
CO2: 24 mmol/L (ref 22–32)
Calcium: 8.7 mg/dL — ABNORMAL LOW (ref 8.9–10.3)
Calcium: 8.9 mg/dL (ref 8.9–10.3)
Calcium: 8.9 mg/dL (ref 8.9–10.3)
Calcium: 9.1 mg/dL (ref 8.9–10.3)
Chloride: 98 mmol/L (ref 98–111)
Chloride: 97 mmol/L — ABNORMAL LOW (ref 98–111)
Chloride: 101 mmol/L (ref 98–111)
Chloride: 98 mmol/L (ref 98–111)
Creatinine, Ser: 0.69 mg/dL (ref 0.61–1.24)
Creatinine, Ser: 0.79 mg/dL (ref 0.61–1.24)
Creatinine, Ser: 0.83 mg/dL (ref 0.61–1.24)
Creatinine, Ser: 0.72 mg/dL (ref 0.61–1.24)
GFR, Estimated: >60 mL/min (ref >60)
GFR, Estimated: >60 mL/min (ref >60)
GFR, Estimated: >60 mL/min (ref >60)
GFR, Estimated: >60 mL/min (ref >60)
Glucose, Bld: 129 mg/dL — ABNORMAL HIGH (ref 70–99)
Glucose, Bld: 174 mg/dL — ABNORMAL HIGH (ref 70–99)
Glucose, Bld: 103 mg/dL — ABNORMAL HIGH (ref 70–99)
Glucose, Bld: 201 mg/dL — ABNORMAL HIGH (ref 70–99)
Potassium: 3 mmol/L — ABNORMAL LOW (ref 3.5–5.1)
Potassium: 4 mmol/L (ref 3.5–5.1)
Potassium: 3.7 mmol/L (ref 3.5–5.1)
Potassium: 3.5 mmol/L (ref 3.5–5.1)
Sodium: 131 mmol/L — ABNORMAL LOW (ref 135–145)
Sodium: 131 mmol/L — ABNORMAL LOW (ref 135–145)
Sodium: 131 mmol/L — ABNORMAL LOW (ref 135–145)
Sodium: 133 mmol/L — ABNORMAL LOW (ref 135–145)

## 2023-11-19 LAB — GLUCOSE, CAPILLARY
Glucose-Capillary: 105 mg/dL — ABNORMAL HIGH (ref 70–99)
Glucose-Capillary: 120 mg/dL — ABNORMAL HIGH (ref 70–99)
Glucose-Capillary: 132 mg/dL — ABNORMAL HIGH (ref 70–99)
Glucose-Capillary: 146 mg/dL — ABNORMAL HIGH (ref 70–99)
Glucose-Capillary: 146 mg/dL — ABNORMAL HIGH (ref 70–99)
Glucose-Capillary: 148 mg/dL — ABNORMAL HIGH (ref 70–99)
Glucose-Capillary: 150 mg/dL — ABNORMAL HIGH (ref 70–99)
Glucose-Capillary: 151 mg/dL — ABNORMAL HIGH (ref 70–99)
Glucose-Capillary: 172 mg/dL — ABNORMAL HIGH (ref 70–99)
Glucose-Capillary: 267 mg/dL — ABNORMAL HIGH (ref 70–99)
Glucose-Capillary: 277 mg/dL — ABNORMAL HIGH (ref 70–99)
Glucose-Capillary: 324 mg/dL — ABNORMAL HIGH (ref 70–99)

## 2023-11-19 LAB — HEPATIC FUNCTION PANEL
ALT: 22 U/L (ref 0–44)
AST: 19 U/L (ref 15–41)
Albumin: 3.4 g/dL — ABNORMAL LOW (ref 3.5–5.0)
Alkaline Phosphatase: 64 U/L (ref 38–126)
Bilirubin, Direct: 0.1 mg/dL (ref 0.0–0.2)
Indirect Bilirubin: 0.9 mg/dL (ref 0.3–0.9)
Total Bilirubin: 1 mg/dL (ref 0.0–1.2)
Total Protein: 6.4 g/dL — ABNORMAL LOW (ref 6.5–8.1)

## 2023-11-19 LAB — MRSA NEXT GEN BY PCR, NASAL: MRSA by PCR Next Gen: NOT DETECTED

## 2023-11-19 LAB — OSMOLALITY: Osmolality: 287 mosm/kg (ref 275–295)

## 2023-11-19 LAB — CBC
HCT: 35.7 % — ABNORMAL LOW (ref 39.0–52.0)
Hemoglobin: 12.8 g/dL — ABNORMAL LOW (ref 13.0–17.0)
MCH: 32.2 pg (ref 26.0–34.0)
MCHC: 35.9 g/dL (ref 30.0–36.0)
MCV: 89.9 fL (ref 80.0–100.0)
Platelets: 236 10*3/uL (ref 150–400)
RBC: 3.97 MIL/uL — ABNORMAL LOW (ref 4.22–5.81)
RDW: 11.4 % — ABNORMAL LOW (ref 11.5–15.5)
WBC: 8.2 10*3/uL (ref 4.0–10.5)
nRBC: 0 % (ref 0.0–0.2)

## 2023-11-19 LAB — HEMOGLOBIN A1C
Hgb A1c MFr Bld: 10.9 % — ABNORMAL HIGH (ref 4.8–5.6)
Mean Plasma Glucose: 266 mg/dL

## 2023-11-19 LAB — MAGNESIUM: Magnesium: 1.8 mg/dL (ref 1.7–2.4)

## 2023-11-19 LAB — HIV ANTIBODY (ROUTINE TESTING W REFLEX): HIV Screen 4th Generation wRfx: NONREACTIVE

## 2023-11-19 LAB — PHOSPHORUS: Phosphorus: 2.8 mg/dL (ref 2.5–4.6)

## 2023-11-19 MED ORDER — INSULIN GLARGINE-YFGN 100 UNIT/ML ~~LOC~~ SOLN
15.0000 [IU] | Freq: Every day | SUBCUTANEOUS | Status: DC
  Administered 2023-11-19: 15 [IU] via SUBCUTANEOUS
  Filled 2023-11-19 (×3): qty 0.15

## 2023-11-19 MED ORDER — POTASSIUM CHLORIDE 10 MEQ/100ML IV SOLN
10.0000 meq | INTRAVENOUS | Status: AC
  Administered 2023-11-19 (×4): 10 meq via INTRAVENOUS
  Filled 2023-11-19 (×4): qty 100

## 2023-11-19 MED ORDER — INSULIN ASPART 100 UNIT/ML IJ SOLN
3.0000 [IU] | Freq: Three times a day (TID) | INTRAMUSCULAR | Status: DC
  Administered 2023-11-19 – 2023-11-20 (×3): 3 [IU] via SUBCUTANEOUS

## 2023-11-19 MED ORDER — POTASSIUM CHLORIDE CRYS ER 20 MEQ PO TBCR
40.0000 meq | EXTENDED_RELEASE_TABLET | Freq: Once | ORAL | Status: AC
  Administered 2023-11-19: 40 meq via ORAL
  Filled 2023-11-19: qty 2

## 2023-11-19 MED ORDER — INSULIN ASPART 100 UNIT/ML IJ SOLN
0.0000 [IU] | Freq: Every day | INTRAMUSCULAR | Status: DC
  Administered 2023-11-19: 4 [IU] via SUBCUTANEOUS

## 2023-11-19 MED ORDER — LIVING WELL WITH DIABETES BOOK: Freq: Once | Status: AC

## 2023-11-19 MED ORDER — INSULIN ASPART 100 UNIT/ML IJ SOLN
0.0000 [IU] | Freq: Three times a day (TID) | INTRAMUSCULAR | Status: DC
  Administered 2023-11-19: 5 [IU] via SUBCUTANEOUS
  Administered 2023-11-19: 1 [IU] via SUBCUTANEOUS
  Administered 2023-11-20: 5 [IU] via SUBCUTANEOUS
  Administered 2023-11-20: 3 [IU] via SUBCUTANEOUS

## 2023-11-19 MED ORDER — INSULIN STARTER KIT- PEN NEEDLES (ENGLISH)
1.0000 | Freq: Once | Status: AC
  Administered 2023-11-19: 1
  Filled 2023-11-19: qty 1

## 2023-11-19 MED ORDER — LISINOPRIL 10 MG PO TABS
10.0000 mg | ORAL_TABLET | Freq: Every day | ORAL | Status: DC
  Administered 2023-11-19 – 2023-11-20 (×2): 10 mg via ORAL
  Filled 2023-11-19 (×2): qty 1

## 2023-11-19 NOTE — Assessment & Plan Note (Signed)
Counseling was provided. -Nicotine patch as needed 

## 2023-11-19 NOTE — Assessment & Plan Note (Signed)
 Blood pressure now mildly elevated. Patient was taking a combination of lisinopril  and HCTZ at home. Discontinuing HCTZ due to hyponatremia -Continue with lisinopril -will uptitrate the dose if needed

## 2023-11-19 NOTE — Progress Notes (Signed)
 Progress Note   Patient: [name] NWG:[id number] DOB: 1971-02-04 DOA: 11/18/2023     0 DOS: the patient was seen and examined on 11/19/2023   Brief hospital course: Taken from H&P.  [name] is a 53 y.o. 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   Previously prediabetic range last A1c was 5.9% in 8/' 24.  Notes that over the past week he has developed polydipsia and polyuria, feeling progressively dehydrated and developing generalized weakness. At occupational health CBG in 400s with UA + ketones so referred to ED. Denies any preceding illness over the past week.  Does acknowledge about 10 to 20 pound weight loss over the past 2 months. Drinking ~5-6 beers/shots per night + smoking cigars.   Patient was initially started on Endo tool with insulin infusion.  5/13: A1c came back above 10.  Gap closed x 3 so he is being transitioned to basal and short-acting.  Patient would like to see how he does with insulin.  He will like to take p.o. medications on discharge if possible. Likely can go home tomorrow morning on metformin and close outpatient PCP follow-up.  Assessment and Plan: * DKA (diabetic ketoacidosis) (HCC) DKA resolved.  Patient with new diagnosis of diabetes with A1c of 10.9.  Initially received insulin infusion. - Transition to Semglee 15 units with SSI -Patient would like to take p.o. medications on discharge and trying to avoid insulin if possible -Continue to monitor for another day  Hyponatremia Patient with mild hyponatremia.  He was on HCTZ at home. HCTZ was discontinued and should not be restarted on discharge. - Continue to monitor  Hypertension Blood pressure now mildly elevated. Patient was taking a combination of lisinopril  and HCTZ at home. Discontinuing  HCTZ due to hyponatremia -Continue with lisinopril -will uptitrate the dose if needed  Hyperlipidemia - Continue home Crestor   Alcohol abuse Counseling was provided.  Drinks 5-6 beers/shots per day. - CIWA monitoring  Bicuspid aortic valve History of ascending aortic aneurysm and heart murmur -Outpatient follow-up with cardiology  Smoking history Counseling was provided. - Nicotine patch as needed   Subjective: Patient was seen and examined today.  He was feeling hungry and wants to eat.  No nausea, vomiting or abdominal pain.  Physical Exam: Vitals:   11/19/23 0800 11/19/23 0900 11/19/23 1000 11/19/23 1145  BP: (!) 137/94 (!) 133/98    Pulse: 70 82 81   Resp: 13 15 15    Temp:    98.2 F (36.8 C)  TempSrc:    Oral  SpO2: 97% 98% 98%   Weight:      Height:       General.  Well-developed gentleman, in no acute distress. Pulmonary.  Lungs clear bilaterally, normal respiratory effort. CV.  Regular rate and rhythm, no JVD, rub or murmur. Abdomen.  Soft, nontender, nondistended, BS positive. CNS.  Alert and oriented .  No focal neurologic deficit. Extremities.  No edema, no cyanosis, pulses intact and symmetrical. Psychiatry.  Judgment and insight appears normal.   Data Reviewed: Prior data reviewed  Family Communication: Discussed with patient  Disposition: Status is: Inpatient Remains inpatient appropriate because: Severity of illness  Planned Discharge Destination: Home  DVT prophylaxis.  Lovenox Time spent: 50 minutes  This record has been created using Conservation officer, historic buildings. Errors have been sought and  corrected,but may not always be located. Such creation errors do not reflect on the standard of care.   Author: Luna Salinas, MD 11/19/2023 2:14 PM  For on call review [email or web address].

## 2023-11-19 NOTE — TOC CM/SW Note (Signed)
 Transition of Care [facility]) - Inpatient Brief Assessment   Patient Details  Name: [name] MRN: [id number] Date of Birth: [birth year]  Transition of [facility]) CM/SW Contact:    Cyndie Dredge, LCSWA Phone Number: 11/19/2023, 7:48 AM   Clinical Narrative:  Transition of Care Department [facility]) has reviewed patient and no TOC needs have been identified at this time. We will continue to monitor patient advancement through interdisciplinary progression rounds. If new patient transition needs arise, please place a TOC consult.   Transition of Care Asessment: Insurance and Status: Insurance coverage has been reviewed Patient has primary care physician: Yes Home environment has been reviewed: Single family Home Prior level of function:: Independent Prior/Current Home Services: No current home services Social Drivers of Health Review: SDOH reviewed no interventions necessary Readmission risk has been reviewed: Yes Transition of care needs: no transition of care needs at this time

## 2023-11-19 NOTE — Inpatient Diabetes Management (Addendum)
 Inpatient Diabetes Program Recommendations  AACE/ADA: New Consensus Statement on Inpatient Glycemic Control (2015)  Target Ranges:  Prepandial:   less than 140 mg/dL      Peak postprandial:   less than 180 mg/dL (1-2 hours)      Critically ill patients:  140 - 180 mg/dL   Lab Results  Component Value Date   GLUCAP 172 (H) 11/19/2023   HGBA1C 9.4 11/18/2023    Latest Reference Range & Units 11/18/23 16:41  Sodium 135 - 145 mmol/L 127 (L)  Potassium 3.5 - 5.1 mmol/L 4.5  Chloride 98 - 111 mmol/L 88 (L)  CO2 22 - 32 mmol/L 21 (L)  Glucose 70 - 99 mg/dL 562 (H)  BUN 6 - 20 mg/dL 16  Creatinine 1.30 - 8.65 mg/dL 7.84  Calcium  8.9 - 10.3 mg/dL 69.6  Anion gap 5 - 15  18 (H)  (L): Data is abnormally low (H): Data is abnormally high   Latest Reference Range & Units 11/19/23 05:28  Sodium 135 - 145 mmol/L 131 (L)  Potassium 3.5 - 5.1 mmol/L 4.0  Chloride 98 - 111 mmol/L 97 (L)  CO2 22 - 32 mmol/L 24  Glucose 70 - 99 mg/dL 295 (H)  BUN 6 - 20 mg/dL 10  Creatinine 2.84 - 1.32 mg/dL 4.40  Calcium  8.9 - 10.3 mg/dL 8.9  Anion gap 5 - 15  10  (L): Data is abnormally low (H): Data is abnormally high  Diabetes history: Pre diabetes Outpatient Diabetes medications: None Current orders for Inpatient glycemic control: IV insulin  Inpatient Diabetes Program Recommendations:   When ready to transition from IV insulin, please consider: -Semglee 15 units (0.2 units/kg x 85.7 kg = 17 units)-give 2 hrs. Prior to IV discontinued -Novolog 0-9 units q 4 hrs.-give first dose when IV insulin discontinued -Add Novolog meal coverage tid when eating  Ordered Living Well With Diabetes booklet, consult to dietician, insulin pen starter kit for patient.  Nurses, please start insulin injection teaching with patient and allow to give own injections to prepare if discharged home on insulin.  1:15 pm Spoke with patient via phone (DM coordinator @ [facility]). 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  Shifts. States he eats more regularly when he works and normally only eats one meal late in the evening when he doesn't work. When patient works, he does one hr. Of exercise per day.  Patient gives his grandmother insulin injections using an insulin pen and has used it with his work as well. Sent video of insulin pen injection to patient via text to cell.  Thank you, Temprance Wyre E. Surya Schroeter, RN, MSN, CDCES  Diabetes Coordinator Inpatient Glycemic Control Team Team Pager [phone] (8am-5pm) 11/19/2023 10:17 AM

## 2023-11-19 NOTE — Assessment & Plan Note (Signed)
 Patient with mild hyponatremia.  He was on HCTZ at home. HCTZ was discontinued and should not be restarted on discharge. - Continue to monitor

## 2023-11-19 NOTE — Progress Notes (Signed)
 Nutrition Education Note  RD consulted for nutrition education regarding new onset diabetes.   Lab Results  Component Value Date   HGBA1C 10.9 (H) 11/18/2023    RD provided "Carbohydrate Counting for People with Diabetes" and "Plate Method for Diabetes" handouts from the Academy of Nutrition and Dietetics. Discussed different food groups and their effects on blood sugar, emphasizing carbohydrate-containing foods. Provided list of carbohydrates and recommended serving sizes of common foods.  Discussed importance of controlled and consistent carbohydrate intake throughout the day. Provided examples of ways to balance meals/snacks and encouraged intake of high-fiber, whole grain complex carbohydrates. Teach back method used.  Expect good compliance.  Body mass index is 25.63 kg/m. Pt meets criteria for normal weight based on current BMI.  Current diet order is heart healthy carb modified. Labs and medications reviewed. No further nutrition interventions warranted at this time. RD contact information provided. If additional nutrition issues arise, please re-consult RD.  [name] RD, LDN, CNSC Contact via secure chat. If unavailable, use group chat "RD Inpatient."

## 2023-11-19 NOTE — Telephone Encounter (Signed)
 Patient Product/process development scientist completed.    The patient is insured through [employer]. Patient has [employer], may use a copay card, and/or apply for patient assistance if available.    Ran test claim for Dexcom G7 Sensor and the current 30 day co-pay is $45.00.  Ran test claim for Freestyle LIbre 3 plus Sensor and Product Not Covered  This test claim was processed through [employer]- copay amounts may vary at other pharmacies due to [employer], or as the patient moves through the different stages of their insurance plan.     Morgan Arab, CPHT Pharmacy Technician III Certified Patient Advocate [facility] Patient Advocate Team Direct Number: [phone]  Fax: [phone]

## 2023-11-19 NOTE — Assessment & Plan Note (Signed)
 Counseling was provided.  Drinks 5-6 beers/shots per day. - CIWA monitoring

## 2023-11-19 NOTE — Assessment & Plan Note (Signed)
 Continue home Crestor

## 2023-11-19 NOTE — Assessment & Plan Note (Signed)
 DKA resolved.  Patient with new diagnosis of diabetes with A1c of 10.9.  Initially received insulin infusion. - Transition to Semglee 15 units with SSI -Patient would like to take p.o. medications on discharge and trying to avoid insulin if possible -Continue to monitor for another day

## 2023-11-19 NOTE — Assessment & Plan Note (Signed)
 History of ascending aortic aneurysm and heart murmur -Outpatient follow-up with cardiology

## 2023-11-19 NOTE — Hospital Course (Signed)
 Taken from H&P.  [name] is a 53 y.o. 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   Previously prediabetic range last A1c was 5.9% in 8/' 24.  Notes that over the past week he has developed polydipsia and polyuria, feeling progressively dehydrated and developing generalized weakness. At occupational health CBG in 400s with UA + ketones so referred to ED. Denies any preceding illness over the past week.  Does acknowledge about 10 to 20 pound weight loss over the past 2 months. Drinking ~5-6 beers/shots per night + smoking cigars.   Patient was initially started on Endo tool with insulin infusion.  5/13: A1c came back above 10.  Gap closed x 3 so he is being transitioned to basal and short-acting.  Patient would like to see how he does with insulin.  He will like to take p.o. medications on discharge if possible. Likely can go home tomorrow morning on metformin and close outpatient PCP follow-up.

## 2023-11-20 ENCOUNTER — Other Ambulatory Visit (HOSPITAL_COMMUNITY): Payer: Self-pay

## 2023-11-20 ENCOUNTER — Telehealth (HOSPITAL_COMMUNITY): Payer: Self-pay | Admitting: Pharmacy Technician

## 2023-11-20 ENCOUNTER — Telehealth: Payer: Self-pay

## 2023-11-20 DIAGNOSIS — E871 Hypo-osmolality and hyponatremia: Secondary | ICD-10-CM | POA: Diagnosis not present

## 2023-11-20 DIAGNOSIS — E1110 Type 2 diabetes mellitus with ketoacidosis without coma: Secondary | ICD-10-CM | POA: Diagnosis not present

## 2023-11-20 DIAGNOSIS — F1010 Alcohol abuse, uncomplicated: Secondary | ICD-10-CM | POA: Diagnosis not present

## 2023-11-20 LAB — GLUCOSE, CAPILLARY
Glucose-Capillary: 268 mg/dL — ABNORMAL HIGH (ref 70–99)
Glucose-Capillary: 235 mg/dL — ABNORMAL HIGH (ref 70–99)

## 2023-11-20 LAB — CBC
HCT: 35.8 % — ABNORMAL LOW (ref 39.0–52.0)
Hemoglobin: 12.7 g/dL — ABNORMAL LOW (ref 13.0–17.0)
MCH: 32.2 pg (ref 26.0–34.0)
MCHC: 35.5 g/dL (ref 30.0–36.0)
MCV: 90.9 fL (ref 80.0–100.0)
Platelets: 254 10*3/uL (ref 150–400)
RBC: 3.94 MIL/uL — ABNORMAL LOW (ref 4.22–5.81)
RDW: 11.3 % — ABNORMAL LOW (ref 11.5–15.5)
WBC: 6.9 10*3/uL (ref 4.0–10.5)
nRBC: 0 % (ref 0.0–0.2)

## 2023-11-20 LAB — BASIC METABOLIC PANEL WITH GFR
Anion gap: 6 (ref 5–15)
BUN: 8 mg/dL (ref 6–20)
CO2: 23 mmol/L (ref 22–32)
Calcium: 8.4 mg/dL — ABNORMAL LOW (ref 8.9–10.3)
Chloride: 103 mmol/L (ref 98–111)
Creatinine, Ser: 0.73 mg/dL (ref 0.61–1.24)
GFR, Estimated: >60 mL/min (ref >60)
Glucose, Bld: 250 mg/dL — ABNORMAL HIGH (ref 70–99)
Potassium: 3.9 mmol/L (ref 3.5–5.1)
Sodium: 132 mmol/L — ABNORMAL LOW (ref 135–145)

## 2023-11-20 MED ORDER — INSULIN ASPART 100 UNIT/ML IJ SOLN
5.0000 [IU] | Freq: Three times a day (TID) | INTRAMUSCULAR | Status: DC
  Administered 2023-11-20: 5 [IU] via SUBCUTANEOUS

## 2023-11-20 MED ORDER — LISINOPRIL 10 MG PO TABS: 10.0000 mg | ORAL_TABLET | Freq: Every day | ORAL | 1 refills | Status: DC

## 2023-11-20 MED ORDER — INSULIN GLARGINE SOLOSTAR 100 UNIT/ML ~~LOC~~ SOPN: 20.0000 [IU] | PEN_INJECTOR | Freq: Every morning | SUBCUTANEOUS | 1 refills | Status: DC

## 2023-11-20 MED ORDER — ULTRA FLO INSULIN PEN NEEDLES 32G X 4 MM MISC: 2 refills | Status: AC

## 2023-11-20 MED ORDER — NOVOLOG FLEXPEN 100 UNIT/ML ~~LOC~~ SOPN: PEN_INJECTOR | SUBCUTANEOUS | 1 refills | Status: DC

## 2023-11-20 MED ORDER — DEXCOM G7 SENSOR MISC: 1 refills | Status: DC

## 2023-11-20 MED ORDER — INSULIN GLARGINE-YFGN 100 UNIT/ML ~~LOC~~ SOLN
20.0000 [IU] | Freq: Every day | SUBCUTANEOUS | Status: DC
  Administered 2023-11-20: 20 [IU] via SUBCUTANEOUS
  Filled 2023-11-20 (×2): qty 0.2

## 2023-11-20 NOTE — Inpatient Diabetes Management (Signed)
 Inpatient Diabetes Program Recommendations  AACE/ADA: New Consensus Statement on Inpatient Glycemic Control (2015)  Target Ranges:  Prepandial:   less than 140 mg/dL      Peak postprandial:   less than 180 mg/dL (1-2 hours)      Critically ill patients:  140 - 180 mg/dL   Lab Results  Component Value Date   GLUCAP 268 (H) 11/20/2023   HGBA1C 10.9 (H) 11/18/2023    Discharge Recommendations: Other recommendations: Dexcom G7 CGM # [id number] Long acting recommendations: Insulin Glargine (LANTUS) Solostar Pen Lantus 20 units daily  Hypoglycemia treatment recommendations: GVoke 1mg  Supply/Referral recommendations: Glucometer Test strips Lancet device Lancets Pen needles - standard Referral to Nutrition & Diab Services   Use Adult Diabetes Insulin Treatment Post Discharge order set.  Thank you, Kelsay Haggard E. Desere Gwin, RN, MSN, CDCES  Diabetes Coordinator Inpatient Glycemic Control Team Team Pager [phone] (8am-5pm) 11/20/2023 8:50 AM

## 2023-11-20 NOTE — Progress Notes (Signed)
 Discharge instructions explained. IV removed. No needs at this time. Waiting for ride.

## 2023-11-20 NOTE — Inpatient Diabetes Management (Signed)
 Inpatient Diabetes Program Recommendations  AACE/ADA: New Consensus Statement on Inpatient Glycemic Control (2015)  Target Ranges:  Prepandial:   less than 140 mg/dL      Peak postprandial:   less than 180 mg/dL (1-2 hours)      Critically ill patients:  140 - 180 mg/dL   Lab Results  Component Value Date   GLUCAP 268 (H) 11/20/2023   HGBA1C 10.9 (H) 11/18/2023    Latest Reference Range & Units 11/19/23 16:38 11/19/23 21:26 11/20/23 07:33  Glucose-Capillary 70 - 99 mg/dL 161 (H) 096 (H) 045 (H)  (H): Data is abnormally high  Diabetes history: Pre diabetes Outpatient Diabetes medications: None Current orders for Inpatient glycemic control: Semglee 15 units daily, Novolog 3 units tid meal coverage, Novolog 0-9 units tid, 0-5 units hs  Inpatient Diabetes Program Recommendations:   Fasting CBG 268 Please consider: -Increase Semglee to 20 units daily -Increase Novolog meal coverage to 5 units tid if eats 50%  On discharge, patient may be able to D/C on Metformin and basal insulin.  Thank you, Arwilda Georgia E. Nazaret Chea, RN, MSN, CDCES  Diabetes Coordinator Inpatient Glycemic Control Team Team Pager [phone] (8am-5pm) 11/20/2023 8:49 AM

## 2023-11-20 NOTE — Discharge Summary (Signed)
 Physician Discharge Summary   Patient: [name] MRN: [id number] DOB: 1971-05-19  Admit date:     11/18/2023  Discharge date: 11/20/23  Discharge Physician: Myrtie Atkinson Karli Wickizer   PCP: [facility]   Recommendations at discharge:   Please follow up with primary care provider within 1-2 weeks  Please repeat BMP and CBC in one week    Hospital Course: Taken from H&P.  [name] is a 53 y.o. 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   Previously prediabetic range last A1c was 5.9% in 8/' 24.  Notes that over the past week he has developed polydipsia and polyuria, feeling progressively dehydrated and developing generalized weakness. At occupational health CBG in 400s with UA + ketones so referred to ED. Denies any preceding illness over the past week.  Does acknowledge about 10 to 20 pound weight loss over the past 2 months. Drinking ~5-6 beers/shots per night + smoking cigars.   Patient was initially started on Endo tool with insulin infusion.  5/13: A1c came back above 10.  Gap closed x 3 so he is being transitioned to basal and short-acting.  Patient would like to see how he does with insulin.  He will like to take p.o. medications on discharge if possible. Likely can go home tomorrow morning on metformin and close outpatient PCP follow-up.  Assessment and Plan: DKA (diabetic ketoacidosis) (HCC) DKA resolved.  Patient with new diagnosis of diabetes with A1c of 10.9.  Initially received insulin infusion. - Transition to Semglee 20 units with SSI - d/c home with lantus solostar 20 units - d/c home with Dexcom CGM - d/c home with lispro sliding scale   Hyponatremia Patient with mild hyponatremia.  He was on HCTZ at home. HCTZ was discontinued and should not be  restarted on discharge. - overall stable   Hypertension Blood pressure now mildly elevated. Patient was taking a combination of lisinopril  and HCTZ at home. Discontinuing HCTZ due to hyponatremia -Continue with lisinopril -will uptitrate the dose if needed   Hyperlipidemia - Continue home Crestor    Alcohol abuse Counseling was provided.  Drinks 5-6 beers/shots per day. - CIWA monitoring - no withdraw   Bicuspid aortic valve History of ascending aortic aneurysm and heart murmur -Outpatient follow-up with cardiology   Smoking history Counseling was provided. - Nicotine patch as needed         Consultants: none Procedures performed: none  Disposition: Home Diet recommendation:  Carb modified diet DISCHARGE MEDICATION: Allergies as of 11/20/2023   No Known Allergies      Medication List     STOP taking these medications    lisinopril -hydrochlorothiazide  10-12.5 MG tablet Commonly known as: ZESTORETIC        TAKE these medications    Dexcom G7 Sensor Misc Use to monitor sugars three times daily   Insulin Glargine Solostar 100 UNIT/ML Solostar Pen Commonly known as: LANTUS Inject 20 Units into the skin in the morning.   lisinopril  10 MG tablet Commonly known as: ZESTRIL  Take 1 tablet (10 mg total) by mouth daily. Start taking on: Nov 21, 2023   NovoLOG FlexPen 100 UNIT/ML FlexPen Generic drug: insulin aspart TID with meals  121-150 2 units; 151-200 3 units; 201-250 5 units; 251-300 8 units; 301-350 11 units; 351-400 15 units   rosuvastatin  20 MG tablet  Commonly known as: CRESTOR  TAKE 1 TABLET BY MOUTH EVERY DAY   Ultra Flo Insulin Pen Needles 32G X 4 MM Misc Generic drug: Insulin Pen Needle Use with insulin pen to dispense insulin as directed        Discharge Exam: Filed Weights   11/18/23 1555  Weight: 85.7 kg   HEENT:  [place]/AT, No thrush, no icterus CV:  RRR, no rub, no S3, no S4 Lung:  CTA, no wheeze, no rhonchi Abd:  soft/+BS,  NT Ext:  No edema, no lymphangitis, no synovitis, no rash   Condition at discharge: stable  The results of significant diagnostics from this hospitalization (including imaging, microbiology, ancillary and laboratory) are listed below for reference.   Imaging Studies: No results found.  Microbiology: Results for orders placed or performed during the hospital encounter of 11/18/23  MRSA Next Gen by PCR, Nasal     Status: None   Collection Time: 11/18/23 11:43 PM   Specimen: Nasal Mucosa; Nasal Swab  Result Value Ref Range Status   MRSA by PCR Next Gen NOT DETECTED NOT DETECTED Final    Comment: (NOTE) The GeneXpert MRSA Assay (FDA approved for NASAL specimens only), is one component of a comprehensive MRSA colonization surveillance program. It is not intended to diagnose MRSA infection nor to guide or monitor treatment for MRSA infections. Test performance is not FDA approved in patients less than 22 years old. Performed at [facility], [address] [place], [place] [address]     Labs: CBC: Recent Labs  Lab 11/18/23 1641 11/19/23 0137 11/20/23 0426  WBC 7.9 8.2 6.9  NEUTROABS 4.5  --   --   HGB 15.0 12.8* 12.7*  HCT 43.0 35.7* 35.8*  MCV 91.9 89.9 90.9  PLT 280 236 254   Basic Metabolic Panel: Recent Labs  Lab 11/19/23 0131 11/19/23 0137 11/19/23 0528 11/19/23 0933 11/19/23 1342 11/20/23 0426  NA 131*  --  131* 131* 133* 132*  K 3.0*  --  4.0 3.7 3.5 3.9  CL 98  --  97* 101 98 103  CO2 26  --  24 23 24 23   GLUCOSE 129*  --  174* 103* 201* 250*  BUN 13  --  10 8 8 8   CREATININE 0.69  --  0.79 0.83 0.72 0.73  CALCIUM  8.7*  --  8.9 8.9 9.1 8.4*  MG  --  1.8  --   --   --   --   PHOS  --  2.8  --   --   --   --    Liver Function Tests: Recent Labs  Lab 11/18/23 1641 11/19/23 0137  AST 24 19  ALT 28 22  ALKPHOS 84 64  BILITOT 1.3* 1.0  PROT 8.6* 6.4*  ALBUMIN 4.6 3.4*   CBG: Recent Labs  Lab 11/19/23 1207 11/19/23 1318 11/19/23 1638  11/19/23 2126 11/20/23 0733  GLUCAP 148* 146* 277* 324* 268*    Discharge time spent: greater than 30 minutes.  Signed: Demaris Fillers, MD [facility] 11/20/2023

## 2023-11-20 NOTE — Telephone Encounter (Signed)
 Patient Product/process development scientist completed.    The patient is insured through [employer]. Patient has [employer], may use a copay card, and/or apply for patient assistance if available.    Ran test claim for Lantus Pen and the current 30 day co-pay is $45.00.  Ran test claim for Novolog FlexPen and the current 30 day co-pay is $45.00.  This test claim was processed through [facility]- copay amounts may vary at other pharmacies due to pharmacy/plan contracts, or as the patient moves through the different stages of their insurance plan.     Morgan Arab, CPHT Pharmacy Technician III Certified Patient Advocate [facility] Patient Advocate Team Direct Number: [phone]  Fax: [phone]

## 2023-11-21 ENCOUNTER — Ambulatory Visit: Payer: Self-pay | Admitting: Physician Assistant

## 2023-11-21 ENCOUNTER — Encounter: Payer: Self-pay | Admitting: Physician Assistant

## 2023-11-21 ENCOUNTER — Ambulatory Visit: Admitting: Physician Assistant

## 2023-11-21 VITALS — BP 123/90 | HR 93 | Temp 97.8°F | Resp 16

## 2023-11-21 DIAGNOSIS — E0801 Diabetes mellitus due to underlying condition with hyperosmolarity with coma: Secondary | ICD-10-CM

## 2023-11-21 MED ORDER — BLOOD GLUCOSE MONITORING SUPPL DEVI: 1.0000 | Freq: Three times a day (TID) | 0 refills | Status: AC

## 2023-11-21 MED ORDER — LANCETS MISC. MISC: 1.0000 | Freq: Three times a day (TID) | 0 refills | Status: AC

## 2023-11-21 MED ORDER — LANCET DEVICE MISC: 1.0000 | Freq: Three times a day (TID) | 0 refills | Status: AC

## 2023-11-21 MED ORDER — METFORMIN HCL 1000 MG PO TABS: 1000.0000 mg | ORAL_TABLET | Freq: Two times a day (BID) | ORAL | 3 refills | Status: AC

## 2023-11-21 MED ORDER — BLOOD GLUCOSE TEST VI STRP: 1.0000 | ORAL_STRIP | Freq: Three times a day (TID) | 0 refills | Status: DC

## 2023-11-21 NOTE — Progress Notes (Incomplete Revision)
   Subjective: Diabetes    Patient ID: [name], male    DOB: 15-Sep-1970, 54 y.o.   MRN: [id number]  HPI Patient follow-up after onset of diabetes requiring hospitalization on 11/18/2023.  Patient was discharged 11/20/2023.  Patient was discharged with prescription for insulin.  Patient told to follow-up PCP to discuss possible management with metformin.  Currently patient is taking NovoLog on a sliding scale.  Patient also takes Solostar 20 units every morning. Review of Systems Hypertension    Objective:   Physical Exam BP 123/90BP. 123/90. Data is abnormal. Taken on 11/21/23 10:10 AM  Pulse Rate 93  Temp 97.8 F (36.6 C)  Resp 16  SpO2 98 %  Patient fingerstick 180 in clinic. HEENT was unremarkable Neck supple without lymphadenopathy or bruits. Lungs clear to auscultation. Heart regular rate and rhythm.      Assessment & Plan: New onset diabetes.  Due to the patient discharge notes revealing metformin versus insulin patient will take metformin 1000 twice daily evaluation by endocrinologist.  Follow sliding scale until evaluation by endocrinologist.

## 2023-11-21 NOTE — Progress Notes (Signed)
 Discharged from the hospital with new DM Dx and brought meds with him reporting understanding of new insulins and reviewed how to use the flexpens and awaiting Dexcom from pharmacy.  He is in need of a CBG machine order.  Discussed diet and need for protein.  Stated he has a diabetic food book the hospital gave him and documented his CBG since d/c using his family CBG machine.  He said he's trying to eat whole foods and wants to eat better.

## 2023-11-21 NOTE — Addendum Note (Signed)
 Addended by: Walt Gunner on: 11/21/2023 11:18 AM   Modules accepted: Orders

## 2023-11-21 NOTE — Progress Notes (Signed)
   Subjective: Diabetes    Patient ID: [name], male    DOB: 01-Jun-1971, 53 y.o.   MRN: [id number]  HPI Patient follow-up after onset of diabetes requiring hospitalization on 11/18/2023.  Patient was discharged 11/20/2023.  Patient was discharged with prescription for insulin.  Patient told to follow-up PCP to discuss possible management with metformin.  Currently patient is taking NovoLog on a sliding scale.  Patient also takes Solostar 20 units every morning. Review of Systems Hypertension    Objective:   Physical Exam BP 123/90BP. 123/90. Data is abnormal. Taken on 11/21/23 10:10 AM  Pulse Rate 93  Temp 97.8 F (36.6 C)  Resp 16  SpO2 98 %  Patient fingerstick 180 in clinic. HEENT was unremarkable Neck supple without lymphadenopathy or bruits. Lungs clear to auscultation. Heart regular rate and rhythm.      Assessment & Plan: New onset diabetes.  Due to the patient discharge notes revealing metformin versus insulin patient will take metformin 1000 twice daily evaluation by endocrinologist.

## 2023-11-25 ENCOUNTER — Ambulatory Visit: Admitting: Physician Assistant

## 2023-11-25 NOTE — Telephone Encounter (Signed)
 Appt scheduled with provider last week with phone call after released from hospital for med management (late entry) and endocrinology appt is 11/26/23.

## 2023-11-26 DIAGNOSIS — E871 Hypo-osmolality and hyponatremia: Secondary | ICD-10-CM | POA: Diagnosis not present

## 2023-11-26 DIAGNOSIS — Z8639 Personal history of other endocrine, nutritional and metabolic disease: Secondary | ICD-10-CM | POA: Diagnosis not present

## 2023-11-26 DIAGNOSIS — F17290 Nicotine dependence, other tobacco product, uncomplicated: Secondary | ICD-10-CM | POA: Diagnosis not present

## 2023-11-26 DIAGNOSIS — E1165 Type 2 diabetes mellitus with hyperglycemia: Secondary | ICD-10-CM | POA: Diagnosis not present

## 2023-11-26 DIAGNOSIS — E782 Mixed hyperlipidemia: Secondary | ICD-10-CM | POA: Diagnosis not present

## 2023-11-26 DIAGNOSIS — I10 Essential (primary) hypertension: Secondary | ICD-10-CM | POA: Diagnosis not present

## 2023-11-28 ENCOUNTER — Other Ambulatory Visit

## 2023-11-28 ENCOUNTER — Telehealth: Payer: Self-pay

## 2023-11-28 NOTE — Telephone Encounter (Signed)
 Spoke with pt by phone and he stated he saw endocrinology this week and is on the Lantus  20U daily and Metformin  and following with them now.  He is scheduling with an ophthalmologist and is doing well monitoring blood sugar.  Labs were drawn with endocrinology as recommended on hospital d/c summary.

## 2023-12-13 DIAGNOSIS — E119 Type 2 diabetes mellitus without complications: Secondary | ICD-10-CM | POA: Diagnosis not present

## 2023-12-13 DIAGNOSIS — H524 Presbyopia: Secondary | ICD-10-CM | POA: Diagnosis not present

## 2023-12-13 DIAGNOSIS — H52223 Regular astigmatism, bilateral: Secondary | ICD-10-CM | POA: Diagnosis not present

## 2023-12-13 DIAGNOSIS — Z7984 Long term (current) use of oral hypoglycemic drugs: Secondary | ICD-10-CM | POA: Diagnosis not present

## 2023-12-13 DIAGNOSIS — H5203 Hypermetropia, bilateral: Secondary | ICD-10-CM | POA: Diagnosis not present

## 2023-12-25 ENCOUNTER — Other Ambulatory Visit: Payer: Self-pay | Admitting: Physician Assistant

## 2024-01-09 ENCOUNTER — Ambulatory Visit: Admitting: Dietician

## 2024-01-09 DIAGNOSIS — N62 Hypertrophy of breast: Secondary | ICD-10-CM | POA: Diagnosis not present

## 2024-01-09 DIAGNOSIS — Z8639 Personal history of other endocrine, nutritional and metabolic disease: Secondary | ICD-10-CM | POA: Diagnosis not present

## 2024-01-09 DIAGNOSIS — E871 Hypo-osmolality and hyponatremia: Secondary | ICD-10-CM | POA: Diagnosis not present

## 2024-01-09 DIAGNOSIS — E1165 Type 2 diabetes mellitus with hyperglycemia: Secondary | ICD-10-CM | POA: Diagnosis not present

## 2024-01-09 DIAGNOSIS — E782 Mixed hyperlipidemia: Secondary | ICD-10-CM | POA: Diagnosis not present

## 2024-01-17 ENCOUNTER — Other Ambulatory Visit: Payer: Self-pay

## 2024-01-17 DIAGNOSIS — I10 Essential (primary) hypertension: Secondary | ICD-10-CM

## 2024-01-17 MED ORDER — LISINOPRIL 10 MG PO TABS: 10.0000 mg | ORAL_TABLET | Freq: Every day | ORAL | 1 refills | Status: DC

## 2024-02-09 ENCOUNTER — Other Ambulatory Visit: Payer: Self-pay | Admitting: Physician Assistant

## 2024-02-09 DIAGNOSIS — I10 Essential (primary) hypertension: Secondary | ICD-10-CM

## 2024-02-18 ENCOUNTER — Other Ambulatory Visit: Payer: Self-pay

## 2024-02-18 DIAGNOSIS — E0801 Diabetes mellitus due to underlying condition with hyperosmolarity with coma: Secondary | ICD-10-CM

## 2024-02-18 MED ORDER — ACCU-CHEK GUIDE TEST VI STRP
ORAL_STRIP | 11 refills | Status: AC
Start: 2024-02-18 — End: ?

## 2024-02-20 DIAGNOSIS — Z8639 Personal history of other endocrine, nutritional and metabolic disease: Secondary | ICD-10-CM | POA: Diagnosis not present

## 2024-02-20 DIAGNOSIS — E871 Hypo-osmolality and hyponatremia: Secondary | ICD-10-CM | POA: Diagnosis not present

## 2024-02-20 DIAGNOSIS — E782 Mixed hyperlipidemia: Secondary | ICD-10-CM | POA: Diagnosis not present

## 2024-02-20 DIAGNOSIS — E1165 Type 2 diabetes mellitus with hyperglycemia: Secondary | ICD-10-CM | POA: Diagnosis not present

## 2024-02-20 DIAGNOSIS — I10 Essential (primary) hypertension: Secondary | ICD-10-CM | POA: Diagnosis not present

## 2024-02-26 DIAGNOSIS — Z8639 Personal history of other endocrine, nutritional and metabolic disease: Secondary | ICD-10-CM | POA: Insufficient documentation

## 2024-02-26 DIAGNOSIS — N62 Hypertrophy of breast: Secondary | ICD-10-CM | POA: Insufficient documentation

## 2024-02-26 DIAGNOSIS — G9332 Myalgic encephalomyelitis/chronic fatigue syndrome: Secondary | ICD-10-CM | POA: Insufficient documentation

## 2024-02-26 DIAGNOSIS — F17290 Nicotine dependence, other tobacco product, uncomplicated: Secondary | ICD-10-CM | POA: Insufficient documentation

## 2024-02-26 DIAGNOSIS — E1165 Type 2 diabetes mellitus with hyperglycemia: Secondary | ICD-10-CM | POA: Insufficient documentation

## 2024-03-03 ENCOUNTER — Ambulatory Visit: Payer: Self-pay

## 2024-03-03 DIAGNOSIS — E0801 Diabetes mellitus due to underlying condition with hyperosmolarity with coma: Secondary | ICD-10-CM

## 2024-03-03 DIAGNOSIS — Z0289 Encounter for other administrative examinations: Secondary | ICD-10-CM

## 2024-03-03 LAB — POCT URINALYSIS DIPSTICK
Bilirubin, UA: NEGATIVE
Blood, UA: NEGATIVE
Glucose, UA: NEGATIVE
Ketones, UA: NEGATIVE
Leukocytes, UA: NEGATIVE
Nitrite, UA: NEGATIVE
Protein, UA: NEGATIVE
Spec Grav, UA: 1.02 (ref 1.010–1.025)
Urobilinogen, UA: 0.2 U/dL
pH, UA: 6 (ref 5.0–8.0)

## 2024-03-04 LAB — CMP12+LP+TP+TSH+6AC+PSA+CBC…
ALT: 14 IU/L (ref 0–44)
AST: 14 IU/L (ref 0–40)
Albumin: 4.4 g/dL (ref 3.8–4.9)
Alkaline Phosphatase: 60 IU/L (ref 44–121)
BUN/Creatinine Ratio: 17 (ref 9–20)
BUN: 14 mg/dL (ref 6–24)
Basophils Absolute: 0 x10E3/uL (ref 0.0–0.2)
Basos: 1 %
Bilirubin Total: 0.3 mg/dL (ref 0.0–1.2)
Calcium: 9.2 mg/dL (ref 8.7–10.2)
Chloride: 103 mmol/L (ref 96–106)
Chol/HDL Ratio: 2 ratio (ref 0.0–5.0)
Cholesterol, Total: 110 mg/dL (ref 100–199)
Creatinine, Ser: 0.81 mg/dL (ref 0.76–1.27)
EOS (ABSOLUTE): 0.4 x10E3/uL (ref 0.0–0.4)
Eos: 7 %
Estimated CHD Risk: <0.5 times avg. (ref 0.0–1.0)
Free Thyroxine Index: 2.1 (ref 1.2–4.9)
GGT: 5 IU/L (ref 0–65)
Globulin, Total: 2.7 g/dL (ref 1.5–4.5)
Glucose: 98 mg/dL (ref 70–99)
HDL: 55 mg/dL (ref >39)
Hematocrit: 39.8 % (ref 37.5–51.0)
Hemoglobin: 12.5 g/dL — ABNORMAL LOW (ref 13.0–17.7)
Immature Grans (Abs): 0 x10E3/uL (ref 0.0–0.1)
Immature Granulocytes: 0 %
Iron: 77 ug/dL (ref 38–169)
LDH: 153 IU/L (ref 121–224)
LDL Chol Calc (NIH): 44 mg/dL (ref 0–99)
Lymphocytes Absolute: 2.7 x10E3/uL (ref 0.7–3.1)
Lymphs: 44 %
MCH: 29.8 pg (ref 26.6–33.0)
MCHC: 31.4 g/dL — ABNORMAL LOW (ref 31.5–35.7)
MCV: 95 fL (ref 79–97)
Monocytes Absolute: 0.4 x10E3/uL (ref 0.1–0.9)
Monocytes: 6 %
Neutrophils Absolute: 2.5 x10E3/uL (ref 1.4–7.0)
Neutrophils: 42 %
Phosphorus: 3.4 mg/dL (ref 2.8–4.1)
Platelets: 289 x10E3/uL (ref 150–450)
Potassium: 4.1 mmol/L (ref 3.5–5.2)
Prostate Specific Ag, Serum: 0.9 ng/mL (ref 0.0–4.0)
RBC: 4.2 x10E6/uL (ref 4.14–5.80)
RDW: 11.4 % — ABNORMAL LOW (ref 11.6–15.4)
Sodium: 138 mmol/L (ref 134–144)
T3 Uptake Ratio: 28 % (ref 24–39)
T4, Total: 7.6 ug/dL (ref 4.5–12.0)
TSH: 0.953 u[IU]/mL (ref 0.450–4.500)
Total Protein: 7.1 g/dL (ref 6.0–8.5)
Triglycerides: 42 mg/dL (ref 0–149)
Uric Acid: 4.9 mg/dL (ref 3.8–8.4)
VLDL Cholesterol Cal: 11 mg/dL (ref 5–40)
WBC: 6.1 x10E3/uL (ref 3.4–10.8)
eGFR: 107 mL/min/1.73 (ref >59)

## 2024-03-04 LAB — HGB A1C W/O EAG: Hgb A1c MFr Bld: 5.4 % (ref 4.8–5.6)

## 2024-03-12 ENCOUNTER — Ambulatory Visit: Payer: Self-pay | Admitting: Physician Assistant

## 2024-03-12 ENCOUNTER — Encounter: Payer: Self-pay | Admitting: Physician Assistant

## 2024-03-12 VITALS — BP 135/90 | HR 79 | Temp 97.7°F | Resp 16 | Wt 195.0 lb

## 2024-03-12 DIAGNOSIS — Z0000 Encounter for general adult medical examination without abnormal findings: Secondary | ICD-10-CM
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
## 2024-05-05 ENCOUNTER — Telehealth: Payer: Self-pay

## 2024-05-20 DIAGNOSIS — E782 Mixed hyperlipidemia: Secondary | ICD-10-CM | POA: Diagnosis not present

## 2024-05-20 DIAGNOSIS — E119 Type 2 diabetes mellitus without complications: Secondary | ICD-10-CM | POA: Diagnosis not present

## 2024-05-20 DIAGNOSIS — I10 Essential (primary) hypertension: Secondary | ICD-10-CM | POA: Diagnosis not present

## 2024-06-16 ENCOUNTER — Ambulatory Visit: Payer: Self-pay | Admitting: Physician Assistant

## 2024-06-16 ENCOUNTER — Other Ambulatory Visit: Payer: Self-pay | Admitting: Physician Assistant

## 2024-06-16 ENCOUNTER — Encounter: Payer: Self-pay | Admitting: Physician Assistant

## 2024-06-16 VITALS — BP 164/88 | HR 94 | Temp 98.0°F | Resp 16 | Wt 199.0 lb

## 2024-06-16 DIAGNOSIS — R053 Chronic cough: Secondary | ICD-10-CM

## 2024-06-16 MED ORDER — HYDROCOD POLI-CHLORPHE POLI ER 10-8 MG/5ML PO SUER: 5.0000 mL | Freq: Every evening | ORAL | 0 refills | Status: AC | PRN

## 2024-06-16 MED ORDER — BENZONATATE 200 MG PO CAPS: 200.0000 mg | ORAL_CAPSULE | Freq: Three times a day (TID) | ORAL | 0 refills | Status: AC | PRN

## 2024-06-16 MED ORDER — PROMETHAZINE-DM 6.25-15 MG/5ML PO SYRP: 5.0000 mL | ORAL_SOLUTION | Freq: Four times a day (QID) | ORAL | 0 refills | Status: AC | PRN

## 2024-06-16 NOTE — Progress Notes (Signed)
   Subjective: Persistent cough    Patient ID: [name], male    DOB: 08/25/1970, 53 y.o.   MRN: [id number]  HPI Patient complaining of persistent cough for 2 weeks.  Onset of cough after returning from fishing trip.  States cough is worse at night.  States phlegm is clear.  No known contact with COVID-19.  Patient has approximately 15-year history of taking lisinopril .  Patient not take medication this morning since he just got off duty.   Review of Systems Diabetes and hypertension.    Objective:   Physical Exam BP 164/88  BP Location Left Arm  Pulse Rate 94  Temp 98 F (36.7 C)  Weight 199 lb (90.3 kg)  Resp 16  SpO2 97 %   BMI: 26.99 kg/m2  BSA: 2.14 m2  HEENT is remarkable postnasal drainage and erythematous pharynx. Neck is supple for lymphadenopathy or bruits. Lungs are clear to auscultation. Heart regular rate and rhythm.       Assessment & Plan: Chronic cough  Patient given a prescription for Tessalon  and Tussionex to take as directed.  Follow-up if cough continues after finishing medication.

## 2024-06-16 NOTE — Progress Notes (Signed)
 Stated about 2 weeks of cold/sinus symptoms that have improved.  Denies fever or wheezing or dyspnea.  He stated the cough won't go away and using OTC with DM that is productive sometimes.  He reports being on Lisinopril  for years, but it was changed from the combo with the hydrochlorothiazide  when he had been hospitalized.  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  He reports smoking sometimes.

## 2024-07-01 ENCOUNTER — Ambulatory Visit (HOSPITAL_BASED_OUTPATIENT_CLINIC_OR_DEPARTMENT_OTHER)
Admission: RE | Admit: 2024-07-01 | Discharge: 2024-07-01 | Disposition: A | Discharge status: Home / Self Care | Source: Ambulatory Visit | Attending: Cardiology | Admitting: Cardiology

## 2024-07-01 DIAGNOSIS — I7121 Aneurysm of the ascending aorta, without rupture: Secondary | ICD-10-CM | POA: Diagnosis not present

## 2024-07-01 MED ORDER — IOHEXOL 350 MG/ML SOLN
100.0000 mL | Freq: Once | INTRAVENOUS | Status: AC | PRN
  Administered 2024-07-01: 100 mL via INTRAVENOUS

## 2024-07-08 ENCOUNTER — Telehealth: Payer: Self-pay | Admitting: Nurse Practitioner

## 2024-07-08 NOTE — Telephone Encounter (Signed)
 Let patient know that results had not been finalized yet. When they are finalized Dr Lavona will interpret them and someone will reach out to go over those results.

## 2024-07-08 NOTE — Telephone Encounter (Signed)
 Pt called to follow-up on results for his CT done on 07/01/24 please advise

## 2024-07-12 ENCOUNTER — Ambulatory Visit: Payer: Self-pay | Admitting: Cardiology

## 2024-07-12 DIAGNOSIS — I7121 Aneurysm of the ascending aorta, without rupture: Secondary | ICD-10-CM

## 2024-07-24 NOTE — Progress Notes (Signed)
 [name]                                          MRN: [id number]   07/28/2024   The [facility] Quality Team Specialist reviewed this patient medical record for the purposes of chart review for care gap closure. The following were reviewed: chart review for care gap closure-controlling blood pressure and kidney health evaluation for diabetes:eGFR  and uACR.    [facility] Quality Team

## 2025-07-16 ENCOUNTER — Other Ambulatory Visit (HOSPITAL_COMMUNITY)
